# Patient Record
Sex: Male | Born: 1960
Health system: Southern US, Community
[De-identification: ages and names within clinical notes are randomized; demographics above are authoritative.]

## PROBLEM LIST (undated history)

## (undated) DIAGNOSIS — I639 Cerebral infarction, unspecified: Secondary | ICD-10-CM

## (undated) DIAGNOSIS — E782 Mixed hyperlipidemia: Secondary | ICD-10-CM

## (undated) DIAGNOSIS — G919 Hydrocephalus, unspecified: Secondary | ICD-10-CM

## (undated) DIAGNOSIS — K635 Polyp of colon: Secondary | ICD-10-CM

## (undated) DIAGNOSIS — I10 Essential (primary) hypertension: Secondary | ICD-10-CM

## (undated) DIAGNOSIS — I609 Nontraumatic subarachnoid hemorrhage, unspecified: Secondary | ICD-10-CM

## (undated) HISTORY — DX: Polyp of colon: K63.5

## (undated) HISTORY — DX: Mixed hyperlipidemia: E78.2

## (undated) HISTORY — DX: Hydrocephalus, unspecified: G91.9

## (undated) HISTORY — PX: COLONOSCOPY W/ POLYPECTOMY: SHX1380

## (undated) HISTORY — DX: Nontraumatic subarachnoid hemorrhage, unspecified: I60.9

## (undated) HISTORY — DX: Essential (primary) hypertension: I10

## (undated) HISTORY — PX: CSF SHUNT: SHX92

## (undated) HISTORY — PX: VASECTOMY: SHX75

## (undated) HISTORY — DX: Cerebral infarction, unspecified: I63.9

---

## 2008-06-23 DIAGNOSIS — I609 Nontraumatic subarachnoid hemorrhage, unspecified: Secondary | ICD-10-CM

## 2008-06-23 HISTORY — DX: Nontraumatic subarachnoid hemorrhage, unspecified: I60.9

## 2016-12-16 ENCOUNTER — Encounter: Payer: Self-pay | Admitting: Physician Assistant

## 2016-12-16 ENCOUNTER — Ambulatory Visit (INDEPENDENT_AMBULATORY_CARE_PROVIDER_SITE_OTHER): Payer: BLUE CROSS/BLUE SHIELD | Admitting: Physician Assistant

## 2016-12-16 VITALS — BP 106/65 | HR 69 | Resp 16 | Ht 69.0 in | Wt 213.0 lb

## 2016-12-16 DIAGNOSIS — Z8673 Personal history of transient ischemic attack (TIA), and cerebral infarction without residual deficits: Secondary | ICD-10-CM | POA: Diagnosis not present

## 2016-12-16 DIAGNOSIS — Z7689 Persons encountering health services in other specified circumstances: Secondary | ICD-10-CM | POA: Diagnosis not present

## 2016-12-16 DIAGNOSIS — G919 Hydrocephalus, unspecified: Secondary | ICD-10-CM | POA: Diagnosis not present

## 2016-12-16 DIAGNOSIS — I10 Essential (primary) hypertension: Secondary | ICD-10-CM

## 2016-12-16 NOTE — Progress Notes (Signed)
HPI:                                                                James Fritz is a 56 y.o. male who presents to [name]: [facility] today to establish care   Current Concerns include none  HTN: prescribed Lisinopril 10mg  daily, but has been out of medication for a few weeks since moving to [place]. Typically he is compliant with medications. Checks BP's at home. Denies vision change, headache, chest pain with exertion, orthopnea, lightheadedness, syncope and edema. Risk factors include: history of CVA  History of CVA/Hydrocephalus: patient has no focal deficits or symptoms. States he has not needed to follow with neurology for years.  Health Maintenance Health Maintenance  Topic Date Due  . Hepatitis C Screening  04/27/1963  . HIV Screening  07/01/1977  . TETANUS/TDAP  07/01/1981  . COLONOSCOPY  07/01/2012  . INFLUENZA VACCINE  01/21/2017    Past Medical History:  Diagnosis Date  . Colon polyps   . CVA (cerebral vascular accident) (HCC)   . Hydrocephalus   . Hypertension    Past Surgical History:  Procedure Laterality Date  . COLONOSCOPY W/ POLYPECTOMY    . CSF SHUNT    . VASECTOMY     Social History  Substance Use Topics  . Smoking status: Former Smoker    Quit date: 06/23/1992  . Smokeless tobacco: Never Used  . Alcohol use 1.8 - 3.0 oz/week    3 - 5 Standard drinks or equivalent per week   family history includes Diabetes in his father and mother; Heart disease in his brother; Hypertension in his brother and father; Schizophrenia in his sister.  ROS: negative except as noted in the HPI  Medications: No current outpatient prescriptions on file.   No current facility-administered medications for this visit.    No Known Allergies     Objective:  BP 106/65   Pulse 69   Resp 16   Ht 5\' 9"  (1.753 m)   Wt 213 lb (96.6 kg)   BMI 31.45 kg/m  Gen: well-groomed, cooperative, not ill-appearing, no  distress Pulm: Normal work of breathing, normal phonation, clear to auscultation bilaterally CV: Normal rate, regular rhythm, s1 and s2 distinct, no murmurs, clicks or rubs  Neuro: alert and oriented x 3, EOM's intact, no tremor MSK: moving all extremities, normal gait and station, no peripheral edema Psych: good eye contact, normal affect, euthymic mood, normal speech and thought content No results found for this or any previous visit (from the past 72 hour(s)). No results found.   Depression screen PHQ 2/9 12/16/2016  Decreased Interest 0  Down, Depressed, Hopeless 0  PHQ - 2 Score 0    Assessment and Plan: 56 y.o. male with   1. Encounter to establish care - reviewed PMH - Colonoscopy UTD, [facility]  2. History of CVA (cerebrovascular accident) - maintain BP <130/80 - heart healthy lifestyle  3. Hydrocephalus with operating shunt - patient declines referral to Neurology and states he will not go to an appointment with them if one is placed. States he was told by a previous provider that shunt is likely closed at this point and that he does not  need any further imaging or treatment  4. Hypertension goal BP (blood pressure) < 130/80 - BP at goal today without medication - patient to monitor BP's at home and log - DASH eating plan - follow-up in 2 weeks  No orders of the defined types were placed in this encounter.  Patient education and anticipatory guidance given Patient agrees with treatment plan Follow-up in 2 weeks for HTN or sooner as needed  Levonne Hubertharley E. Cummings PA-C

## 2016-12-16 NOTE — Patient Instructions (Addendum)
For your blood pressure: - Check blood pressure at home  - Check around the same time each day in a relaxed setting  - Limit salt. Follow DASH eating plan - Follow-up in 1 month   DASH Eating Plan DASH stands for "Dietary Approaches to Stop Hypertension." The DASH eating plan is a healthy eating plan that has been shown to reduce high blood pressure (hypertension). It may also reduce your risk for type 2 diabetes, heart disease, and stroke. The DASH eating plan may also help with weight loss. What are tips for following this plan? General guidelines  Avoid eating more than 2,300 mg (milligrams) of salt (sodium) a day. If you have hypertension, you may need to reduce your sodium intake to 1,500 mg a day.  Limit alcohol intake to no more than 1 drink a day for nonpregnant women and 2 drinks a day for men. One drink equals 12 oz of beer, 5 oz of wine, or 1 oz of hard liquor.  Work with your health care provider to maintain a healthy body weight or to lose weight. Ask what an ideal weight is for you.  Get at least 30 minutes of exercise that causes your heart to beat faster (aerobic exercise) most days of the week. Activities may include walking, swimming, or biking.  Work with your health care provider or diet and nutrition specialist (dietitian) to adjust your eating plan to your individual calorie needs. Reading food labels  Check food labels for the amount of sodium per serving. Choose foods with less than 5 percent of the Daily Value of sodium. Generally, foods with less than 300 mg of sodium per serving fit into this eating plan.  To find whole grains, look for the word "whole" as the first word in the ingredient list. Shopping  Buy products labeled as "low-sodium" or "no salt added."  Buy fresh foods. Avoid canned foods and premade or frozen meals. Cooking  Avoid adding salt when cooking. Use salt-free seasonings or herbs instead of table salt or sea salt. Check with your health  care provider or pharmacist before using salt substitutes.  Do not fry foods. Cook foods using healthy methods such as baking, boiling, grilling, and broiling instead.  Cook with heart-healthy oils, such as olive, canola, soybean, or sunflower oil. Meal planning   Eat a balanced diet that includes: ? 5 or more servings of fruits and vegetables each day. At each meal, try to fill half of your plate with fruits and vegetables. ? Up to 6-8 servings of whole grains each day. ? Less than 6 oz of lean meat, poultry, or fish each day. A 3-oz serving of meat is about the same size as a deck of cards. One egg equals 1 oz. ? 2 servings of low-fat dairy each day. ? A serving of nuts, seeds, or beans 5 times each week. ? Heart-healthy fats. Healthy fats called Omega-3 fatty acids are found in foods such as flaxseeds and coldwater fish, like sardines, salmon, and mackerel.  Limit how much you eat of the following: ? Canned or prepackaged foods. ? Food that is high in trans fat, such as fried foods. ? Food that is high in saturated fat, such as fatty meat. ? Sweets, desserts, sugary drinks, and other foods with added sugar. ? Full-fat dairy products.  Do not salt foods before eating.  Try to eat at least 2 vegetarian meals each week.  Eat more home-cooked food and less restaurant, buffet, and fast food.  When eating at a restaurant, ask that your food be prepared with less salt or no salt, if possible. What foods are recommended? The items listed may not be a complete list. Talk with your dietitian about what dietary choices are best for you. Grains Whole-grain or whole-wheat bread. Whole-grain or whole-wheat pasta. Brown rice. [place]row. Bulgur. Whole-grain and low-sodium cereals. Pita bread. Low-fat, low-sodium crackers. Whole-wheat flour tortillas. Vegetables Fresh or frozen vegetables (raw, steamed, roasted, or grilled). Low-sodium or reduced-sodium tomato and vegetable juice.  Low-sodium or reduced-sodium tomato sauce and tomato paste. Low-sodium or reduced-sodium canned vegetables. Fruits All fresh, dried, or frozen fruit. Canned fruit in natural juice (without added sugar). Meat and other protein foods Skinless chicken or [place]. Ground chicken or [place]. Pork with fat trimmed off. Fish and seafood. Egg whites. Dried beans, peas, or lentils. Unsalted nuts, nut butters, and seeds. Unsalted canned beans. Lean cuts of beef with fat trimmed off. Low-sodium, lean deli meat. Dairy Low-fat (1%) or fat-free (skim) milk. Fat-free, low-fat, or reduced-fat cheeses. Nonfat, low-sodium ricotta or cottage cheese. Low-fat or nonfat yogurt. Low-fat, low-sodium cheese. Fats and oils Soft margarine without trans fats. Vegetable oil. Low-fat, reduced-fat, or light mayonnaise and salad dressings (reduced-sodium). Canola, safflower, olive, soybean, and sunflower oils. Avocado. Seasoning and other foods Herbs. Spices. Seasoning mixes without salt. Unsalted popcorn and pretzels. Fat-free sweets. What foods are not recommended? The items listed may not be a complete list. Talk with your dietitian about what dietary choices are best for you. Grains Baked goods made with fat, such as croissants, muffins, or some breads. Dry pasta or rice meal packs. Vegetables Creamed or fried vegetables. Vegetables in a cheese sauce. Regular canned vegetables (not low-sodium or reduced-sodium). Regular canned tomato sauce and paste (not low-sodium or reduced-sodium). Regular tomato and vegetable juice (not low-sodium or reduced-sodium). Angie Fava. Olives. Fruits Canned fruit in a light or heavy syrup. Fried fruit. Fruit in cream or butter sauce. Meat and other protein foods Fatty cuts of meat. Ribs. Fried meat. Berniece Salines. Sausage. Bologna and other processed lunch meats. Salami. Fatback. Hotdogs. Bratwurst. Salted nuts and seeds. Canned beans with added salt. Canned or smoked fish. Whole eggs or egg yolks. Chicken  or [place] with skin. Dairy Whole or 2% milk, cream, and half-and-half. Whole or full-fat cream cheese. Whole-fat or sweetened yogurt. Full-fat cheese. Nondairy creamers. Whipped toppings. Processed cheese and cheese spreads. Fats and oils Butter. Stick margarine. Lard. Shortening. Ghee. Bacon fat. Tropical oils, such as coconut, palm kernel, or palm oil. Seasoning and other foods Salted popcorn and pretzels. Onion salt, garlic salt, seasoned salt, table salt, and sea salt. Worcestershire sauce. Tartar sauce. Barbecue sauce. Teriyaki sauce. Soy sauce, including reduced-sodium. Steak sauce. Canned and packaged gravies. Fish sauce. Oyster sauce. Cocktail sauce. Horseradish that you find on the shelf. Ketchup. Mustard. Meat flavorings and tenderizers. Bouillon cubes. Hot sauce and Tabasco sauce. Premade or packaged marinades. Premade or packaged taco seasonings. Relishes. Regular salad dressings. Where to find more information:  National Heart, Lung, and [place]: https://wilson-eaton.com/  American Heart Association: www.heart.org Summary  The DASH eating plan is a healthy eating plan that has been shown to reduce high blood pressure (hypertension). It may also reduce your risk for type 2 diabetes, heart disease, and stroke.  With the DASH eating plan, you should limit salt (sodium) intake to 2,300 mg a day. If you have hypertension, you may need to reduce your sodium intake to 1,500 mg a day.  When on the DASH eating plan,  aim to eat more fresh fruits and vegetables, whole grains, lean proteins, low-fat dairy, and heart-healthy fats.  Work with your health care provider or diet and nutrition specialist (dietitian) to adjust your eating plan to your individual calorie needs. This information is not intended to replace advice given to you by your health care provider. Make sure you discuss any questions you have with your health care provider. Document Released: 05/29/2011 Document Revised:  06/02/2016 Document Reviewed: 06/02/2016 Elsevier Interactive Patient Education  2017 [employer].

## 2016-12-21 ENCOUNTER — Encounter: Payer: Self-pay | Admitting: Physician Assistant

## 2016-12-21 DIAGNOSIS — E1159 Type 2 diabetes mellitus with other circulatory complications: Secondary | ICD-10-CM | POA: Insufficient documentation

## 2016-12-21 DIAGNOSIS — I10 Essential (primary) hypertension: Secondary | ICD-10-CM

## 2017-01-13 ENCOUNTER — Ambulatory Visit: Payer: BLUE CROSS/BLUE SHIELD | Admitting: Physician Assistant

## 2017-01-27 ENCOUNTER — Encounter: Payer: Self-pay | Admitting: Family Medicine

## 2017-01-27 ENCOUNTER — Ambulatory Visit (INDEPENDENT_AMBULATORY_CARE_PROVIDER_SITE_OTHER): Payer: BLUE CROSS/BLUE SHIELD | Admitting: Family Medicine

## 2017-01-27 VITALS — BP 133/77 | HR 84 | Ht 69.0 in | Wt 214.0 lb

## 2017-01-27 DIAGNOSIS — I10 Essential (primary) hypertension: Secondary | ICD-10-CM

## 2017-01-27 MED ORDER — LOSARTAN POTASSIUM 25 MG PO TABS: 25.0000 mg | ORAL_TABLET | Freq: Every day | ORAL | 1 refills | Status: DC

## 2017-01-27 NOTE — Patient Instructions (Signed)
Thank you for coming in today. Restart losartan.  Schedule annual physical with Vinetta BergamoCharley in November or December.  Get labs then.    Losartan tablets What is this medicine? LOSARTAN (loe SAR tan) is used to treat high blood pressure and to reduce the risk of stroke in certain patients. This drug also slows the progression of kidney disease in patients with diabetes. This medicine may be used for other purposes; ask your health care provider or pharmacist if you have questions. COMMON BRAND NAME(S): Cozaar What should I tell my health care provider before I take this medicine? They need to know if you have any of these conditions: -heart failure -kidney or liver disease -an unusual or allergic reaction to losartan, other medicines, foods, dyes, or preservatives -pregnant or trying to get pregnant -breast-feeding How should I use this medicine? Take this medicine by mouth with a glass of water. Follow the directions on the prescription label. This medicine can be taken with or without food. Take your doses at regular intervals. Do not take your medicine more often than directed. Talk to your pediatrician regarding the use of this medicine in children. Special care may be needed. Overdosage: If you think you have taken too much of this medicine contact a poison control center or emergency room at once. NOTE: This medicine is only for you. Do not share this medicine with others. What if I miss a dose? If you miss a dose, take it as soon as you can. If it is almost time for your next dose, take only that dose. Do not take double or extra doses. What may interact with this medicine? -blood pressure medicines -diuretics, especially triamterene, spironolactone, or amiloride -fluconazole -NSAIDs, medicines for pain and inflammation, like ibuprofen or naproxen -potassium salts or potassium supplements -rifampin This list may not describe all possible interactions. Give your health care provider  a list of all the medicines, herbs, non-prescription drugs, or dietary supplements you use. Also tell them if you smoke, drink alcohol, or use illegal drugs. Some items may interact with your medicine. What should I watch for while using this medicine? Visit your doctor or health care professional for regular checks on your progress. Check your blood pressure as directed. Ask your doctor or health care professional what your blood pressure should be and when you should contact him or her. Call your doctor or health care professional if you notice an irregular or fast heart beat. Women should inform their doctor if they wish to become pregnant or think they might be pregnant. There is a potential for serious side effects to an unborn child, particularly in the second or third trimester. Talk to your health care professional or pharmacist for more information. You may get drowsy or dizzy. Do not drive, use machinery, or do anything that needs mental alertness until you know how this drug affects you. Do not stand or sit up quickly, especially if you are an older patient. This reduces the risk of dizzy or fainting spells. Alcohol can make you more drowsy and dizzy. Avoid alcoholic drinks. Avoid salt substitutes unless you are told otherwise by your doctor or health care professional. Do not treat yourself for coughs, colds, or pain while you are taking this medicine without asking your doctor or health care professional for advice. Some ingredients may increase your blood pressure. What side effects may I notice from receiving this medicine? Side effects that you should report to your doctor or health care professional as soon as  possible: -confusion, dizziness, light headedness or fainting spells -decreased amount of urine passed -difficulty breathing or swallowing, hoarseness, or tightening of the throat -fast or irregular heart beat, palpitations, or chest pain -skin rash, itching -swelling of your  face, lips, tongue, hands, or feet Side effects that usually do not require medical attention (report to your doctor or health care professional if they continue or are bothersome): -cough -decreased sexual function or desire -headache -nasal congestion or stuffiness -nausea or stomach pain -sore or cramping muscles This list may not describe all possible side effects. Call your doctor for medical advice about side effects. You may report side effects to FDA at 1-800-FDA-1088. Where should I keep my medicine? Keep out of the reach of children. Store at room temperature between 15 and 30 degrees C (59 and 86 degrees F). Protect from light. Keep container tightly closed. Throw away any unused medicine after the expiration date. NOTE: This sheet is a summary. It may not cover all possible information. If you have questions about this medicine, talk to your doctor, pharmacist, or health care provider.  2018 Elsevier/Gold Standard (2007-08-20 16:42:18)

## 2017-01-27 NOTE — Progress Notes (Signed)
       James MurdochJoseph Fritz is a 56 y.o. male [facility] today for follow up HTN. Jomarie LongsJoseph had a subarachnoid bleed a few years ago resulting in hydrocephalus. He has had mildly elevated blood pressure. His prior primary care doctor prescribed lisinopril which controlled the blood pressure quite well but caused a cough. He was switched to a similar medicine that he can't remove the name of which worked well. He would like a refill of these medicines medications if possible. He denies any chest pain palpitations or shortness of breath. He feels well otherwise.   Past Medical History:  Diagnosis Date  . Colon polyps   . CVA (cerebral vascular accident) (HCC)   . Hydrocephalus   . Hypertension    Past Surgical History:  Procedure Laterality Date  . COLONOSCOPY W/ POLYPECTOMY    . CSF SHUNT    . VASECTOMY     Social History  Substance Use Topics  . Smoking status: Former Smoker    Quit date: 06/23/1992  . Smokeless tobacco: Never Used  . Alcohol use 1.8 - 3.0 oz/week    3 - 5 Standard drinks or equivalent per week   family history includes Diabetes in his father and mother; Heart disease in his brother; Hypertension in his brother and father; Schizophrenia in his sister.  ROS as above:  Medications: Current Outpatient Prescriptions  Medication Sig Dispense Refill  . losartan (COZAAR) 25 MG tablet Take 1 tablet (25 mg total) by mouth daily. 90 tablet 1   No current facility-administered medications for this visit.    No Known Allergies  Health Maintenance Health Maintenance  Topic Date Due  . Hepatitis C Screening  [birth year]  . HIV Screening  07/01/1977  . TETANUS/TDAP  07/01/1981  . COLONOSCOPY  07/01/2012  . INFLUENZA VACCINE  01/21/2017     Exam:  BP 133/77   Pulse 84   Ht 5\' 9"  (1.753 m)   Wt 214 lb (97.1 kg)   BMI 31.60 kg/m    133/77 Gen: Well NAD HEENT: EOMI,  MMM Lungs: Normal work of breathing. CTABL Heart: RRR no MRG Abd: NABS, Soft. Nondistended, Nontender Exts: Brisk capillary refill, warm and well perfused.    No results found for this or any previous visit (from the past 72 hour(s)). No results found.    Assessment and Plan: 56 y.o. male with mildly elevated blood pressure. Plan to use low-dose losartan. We'll recheck metabolic panel this fall as part of the wellness visit. Return to care of PCP.   No orders of the defined types were placed in this encounter.  Meds ordered this encounter  Medications  . losartan (COZAAR) 25 MG tablet    Sig: Take 1 tablet (25 mg total) by mouth daily.    Dispense:  90 tablet    Refill:  1     Discussed warning signs or symptoms. Please see discharge instructions. Patient expresses understanding.

## 2017-04-27 ENCOUNTER — Encounter: Payer: BLUE CROSS/BLUE SHIELD | Admitting: Physician Assistant

## 2017-04-27 DIAGNOSIS — Z0189 Encounter for other specified special examinations: Secondary | ICD-10-CM

## 2017-05-08 ENCOUNTER — Ambulatory Visit (INDEPENDENT_AMBULATORY_CARE_PROVIDER_SITE_OTHER): Payer: BLUE CROSS/BLUE SHIELD | Admitting: Physician Assistant

## 2017-05-08 ENCOUNTER — Encounter: Payer: Self-pay | Admitting: Physician Assistant

## 2017-05-08 VITALS — BP 118/76 | HR 71 | Ht 69.0 in | Wt 217.0 lb

## 2017-05-08 DIAGNOSIS — Z130 Encounter for screening for diseases of the blood and blood-forming organs and certain disorders involving the immune mechanism: Secondary | ICD-10-CM | POA: Diagnosis not present

## 2017-05-08 DIAGNOSIS — Z125 Encounter for screening for malignant neoplasm of prostate: Secondary | ICD-10-CM

## 2017-05-08 DIAGNOSIS — Z0000 Encounter for general adult medical examination without abnormal findings: Secondary | ICD-10-CM

## 2017-05-08 DIAGNOSIS — E6609 Other obesity due to excess calories: Secondary | ICD-10-CM | POA: Diagnosis not present

## 2017-05-08 DIAGNOSIS — Z1211 Encounter for screening for malignant neoplasm of colon: Secondary | ICD-10-CM

## 2017-05-08 DIAGNOSIS — Z23 Encounter for immunization: Secondary | ICD-10-CM

## 2017-05-08 DIAGNOSIS — Z131 Encounter for screening for diabetes mellitus: Secondary | ICD-10-CM | POA: Diagnosis not present

## 2017-05-08 DIAGNOSIS — Z973 Presence of spectacles and contact lenses: Secondary | ICD-10-CM | POA: Insufficient documentation

## 2017-05-08 DIAGNOSIS — Z13220 Encounter for screening for lipoid disorders: Secondary | ICD-10-CM | POA: Diagnosis not present

## 2017-05-08 DIAGNOSIS — Z6832 Body mass index (BMI) 32.0-32.9, adult: Secondary | ICD-10-CM | POA: Insufficient documentation

## 2017-05-08 DIAGNOSIS — I10 Essential (primary) hypertension: Secondary | ICD-10-CM

## 2017-05-08 DIAGNOSIS — Z1159 Encounter for screening for other viral diseases: Secondary | ICD-10-CM | POA: Diagnosis not present

## 2017-05-08 DIAGNOSIS — Z136 Encounter for screening for cardiovascular disorders: Secondary | ICD-10-CM

## 2017-05-08 DIAGNOSIS — E66811 Obesity, class 1: Secondary | ICD-10-CM | POA: Insufficient documentation

## 2017-05-08 MED ORDER — LOSARTAN POTASSIUM 25 MG PO TABS: 50.0000 mg | ORAL_TABLET | Freq: Every day | ORAL | 1 refills | Status: DC

## 2017-05-08 NOTE — Progress Notes (Signed)
HPI:                                                                Gala MurdochJoseph Paulo is a 56 y.o. male [facility] today for annual physical exam  Current concerns: none  Past Medical History:  Diagnosis Date  . Colon polyps   . CVA (cerebral vascular accident) (HCC)   . Hydrocephalus   . Hypertension   . SAH (subarachnoid hemorrhage) (HCC) 2010   Past Surgical History:  Procedure Laterality Date  . COLONOSCOPY W/ POLYPECTOMY    . CSF SHUNT    . VASECTOMY     Social History   Tobacco Use  . Smoking status: Former Smoker    Last attempt to quit: 06/23/1992    Years since quitting: 24.8  . Smokeless tobacco: Never Used  Substance Use Topics  . Alcohol use: Yes    Alcohol/week: 1.8 - 3.0 oz    Types: 3 - 5 Standard drinks or equivalent per week   family history includes Diabetes in his father and mother; Heart disease in his brother; Hypertension in his brother and father; Schizophrenia in his sister.  ROS: negative except as noted in the HPI  Medications: Current Outpatient Medications  Medication Sig Dispense Refill  . losartan (COZAAR) 25 MG tablet Take 2 tablets (50 mg total) daily by mouth. 180 tablet 1   No current facility-administered medications for this visit.    No Known Allergies   Objective:  BP (!) 149/83   Pulse 76   Ht 5\' 9"  (1.753 m)   Wt 217 lb (98.4 kg)   BMI 32.05 kg/m  General Appearance:  Alert, cooperative, no distress, appropriate for age, obese male                            Head:  Normocephalic, without obvious abnormality                             Eyes:  PERRL, EOM's intact, conjunctiva and cornea clear, wearing contact lenses                             Ears:  TM pearly gray color and semitransparent, external ear canals normal, both ears                            Nose:  Nares symmetrical                          Throat:  Lips, tongue, and mucosa are moist, pink, and  intact; good dentition                             Neck:  Supple; symmetrical, trachea midline, no adenopathy; thyroid: no enlargement, symmetric, no tenderness/mass/nodules                             Back:  Symmetrical, no curvature,  ROM normal                                 Lungs:  Clear to auscultation bilaterally, respirations unlabored                             Heart:  regular rate & normal rhythm, S1 and S2 normal, no murmurs, rubs, or gallops                     Abdomen:  Obese, soft, non-tender, no mass or organomegaly, exam limited due to body habitus              Genitourinary:  deferred         Musculoskeletal:  Tone and strength strong and symmetrical, all extremities; no joint pain or edema, normal gait and station                                      Lymphatic:  No adenopathy             Skin/Hair/Nails:  Skin warm, dry and intact, no rashes or abnormal dyspigmentation on limited exam                   Neurologic:  Alert and oriented x3, no cranial nerve deficits, DTR's intact, sensation grossly intact, normal gait and station, no tremor Psych: well-groomed, cooperative, good eye contact, euthymic mood, affect mood-congruent, speech is articulate, and thought processes clear and goal-directed   Dep[facility] screen [facility] 05/08/2017 12/16/2016  Decreased Interest 0 0  Down, Depressed, Hopeless 0 0  PHQ - 2 Score 0 0     No results found for this or any previous visit (from the past 72 hour(s)). No results found.    Assessment and Plan: 56 y.o. male with   1. Annual physical exam - reviewed health maintenance - Colonoscopy due in 2019, patient requesting referral today - negative PHQ2 - influenza given today - declines Shingrix - Tdap UTD per patient - CBC - Comprehensive metabolic panel - Hepatitis C antibody - Lipid Panel w/reflex Direct LDL - PSA  2. Screening for blood disease - CBC  3. Screening for diabetes mellitus - Comprehensive metabolic  panel  4. Encounter for hepatitis C screening test for low risk patient - Hepatitis C antibody  5. Encounter for lipid screening for cardiovascular disease - Lipid Panel w/reflex Direct LDL  6. Class 1 obesity due to excess calories with serious comorbidity and body mass index (BMI) of 32.0 to 32.9 in adult - counseled on weight loss through decreasing caloric intake and increasing aerobic exercise  7. Hypertension goal BP (blood pressure) < 130/80 BP Readings from Last 3 Encounters:  05/08/17 118/76  01/27/17 133/77  12/16/16 106/65  - initial BP out of range, recheck BP in range. Patient to monitor BP at home and titrate Losartan to goal of 130/80 or less. Counseled on therapeutic lifestyle changes - losartan (COZAAR) 25 MG tablet; Take 2 tablets (50 mg total) daily by mouth.  Dispense: 180 tablet; Refill: 1  8. Wears contact lenses   9. Colon cancer screening - Ambulatory referral to Gastroenterology for colonoscopy  10. Screening PSA (prostate specific antigen) - PSA  11. Need for immunization against influenza - Flu Vaccine QUAD 36+  mos IM   Patient education and anticipatory guidance given Patient agrees with treatment plan Follow-up in 1 year for CPE with fasting labs or sooner as needed  Levonne Hubert PA-C

## 2017-05-08 NOTE — Patient Instructions (Addendum)
I have also ordered fasting labs. The lab is a walk-in open M-F 7:30a-4:30p (closed 12:30-1:30p). Nothing to eat or drink after midnight or at least 8 hours before your blood draw. You can have water and your medications.   For your blood pressure: - BP goal <130/80 - Continue your Losartan daily. If BP consistently above 130/80, increase to 50 mg daily (2 tabs) - Limit salt to <2000 mg/day - Follow DASH eating plan - limit alcohol to 2 standard drinks per day - avoid tobacco products - weight loss: 7% of current body weight - Follow-up every 6 months   Physical Activity Recommendations for modifying lipids and lowering blood pressure Engage in aerobic physical activity to reduce LDL-cholesterol, non-HDL-cholesterol, and blood pressure  Frequency: 3-4 sessions per week  Intensity: moderate to vigorous  Duration: 40 minutes on average  Physical Activity Recommendations for secondary prevention 1. Aerobic exercise  Frequency: 3-5 sessions per week  Intensity: 50-80% capacity  Duration: 20 - 60 minutes  Examples: walking, treadmill, cycling, rowing, stair climbing, and arm/leg ergometry  2. Resistance exercise  Frequency: 2-3 sessions per week  Intensity: 10-15 repetitions/set to moderate fatigue  Duration: 1-3 sets of 8-10 upper and lower body exercises  Examples: calisthenics, elastic bands, cuff/hand weights, dumbbels, free weights, wall pulleys, and weight machines  Heart-Healthy Lifestyle  Eating a diet rich in vegetables, fruits and whole grains: also includes low-fat dairy products, poultry, fish, legumes, and nuts; limit intake of sweets, sugar-sweetened beverages and red meats  Getting regular exercise  Maintaining a healthy weight  Not smoking or getting help quitting  Staying on top of your health; for some people, lifestyle changes alone may not be enough to prevent a heart attack or stroke. In these cases, taking a statin at the right dose will most  likely be necessary   Exercising to Lose Weight Exercising can help you to lose weight. In order to lose weight through exercise, you need to do vigorous-intensity exercise. You can tell that you are exercising with vigorous intensity if you are breathing very hard and fast and cannot hold a conversation while exercising. Moderate-intensity exercise helps to maintain your current weight. You can tell that you are exercising at a moderate level if you have a higher heart rate and faster breathing, but you are still able to hold a conversation. How often should I exercise? Choose an activity that you enjoy and set realistic goals. Your health care provider can help you to make an activity plan that works for you. Exercise regularly as directed by your health care provider. This may include:  Doing resistance training twice each week, such as: ? Push-ups. ? Sit-ups. ? Lifting weights. ? Using resistance bands.  Doing a given intensity of exercise for a given amount of time. Choose from these options: ? 150 minutes of moderate-intensity exercise every week. ? 75 minutes of vigorous-intensity exercise every week. ? A mix of moderate-intensity and vigorous-intensity exercise every week.  Children, pregnant women, people who are out of shape, people who are overweight, and older adults may need to consult a health care provider for individual recommendations. If you have any sort of medical condition, be sure to consult your health care provider before starting a new exercise program. What are some activities that can help me to lose weight?  Walking at a rate of at least 4.5 miles an hour.  Jogging or running at a rate of 5 miles per hour.  Biking at a rate of at  least 10 miles per hour.  Lap swimming.  Roller-skating or in-line skating.  Cross-country skiing.  Vigorous competitive sports, such as football, basketball, and soccer.  Jumping rope.  Aerobic dancing. How can I be more  active in my day-to-day activities?  Use the stairs instead of the elevator.  Take a walk during your lunch break.  If you drive, park your car farther away from work or school.  If you take public transportation, get off one stop early and walk the rest of the way.  Make all of your phone calls while standing up and walking around.  Get up, stretch, and walk around every 30 minutes throughout the day. What guidelines should I follow while exercising?  Do not exercise so much that you hurt yourself, feel dizzy, or get very short of breath.  Consult your health care provider prior to starting a new exercise program.  Wear comfortable clothes and shoes with good support.  Drink plenty of water while you exercise to prevent dehydration or heat stroke. Body water is lost during exercise and must be replaced.  Work out until you breathe faster and your heart beats faster. This information is not intended to replace advice given to you by your health care provider. Make sure you discuss any questions you have with your health care provider. Document Released: 07/12/2010 Document Revised: 11/15/2015 Document Reviewed: 11/10/2013 Elsevier Interactive Patient Education  [employer]2018 Else[employer] Inc.

## 2017-05-11 ENCOUNTER — Encounter: Payer: Self-pay | Admitting: Physician Assistant

## 2017-05-12 ENCOUNTER — Encounter: Payer: Self-pay | Admitting: Sports Medicine

## 2017-05-12 ENCOUNTER — Ambulatory Visit (INDEPENDENT_AMBULATORY_CARE_PROVIDER_SITE_OTHER): Payer: BLUE CROSS/BLUE SHIELD | Admitting: Sports Medicine

## 2017-05-12 DIAGNOSIS — L84 Corns and callosities: Secondary | ICD-10-CM

## 2017-05-12 MED ORDER — HYDROCODONE-ACETAMINOPHEN 5-325 MG PO TABS: 1.0000 | ORAL_TABLET | Freq: Three times a day (TID) | ORAL | 0 refills | Status: DC | PRN

## 2017-05-12 NOTE — Patient Instructions (Signed)
Corns and Calluses Corns are small areas of thickened skin that occur on the top, sides, or tip of a toe. They contain a cone-shaped core that is often a trapped sweat gland with a point that can press on a nerve below. This causes pain. Calluses are areas of thickened skin that can occur anywhere on the body including hands, fingers, palms, soles of the feet, and heels.Calluses are usually larger than corns. What are the causes? Corns and calluses are caused by rubbing (friction) or pressure, such as from shoes that are too tight or do not fit properly. What increases the risk? Corns are more likely to develop in people who have toe deformities, such as hammer toes. Since calluses can occur with friction to any area of the skin, calluses are more likely to develop in people who:  Work with their hands.  Wear shoes that fit poorly, shoes that are too tight, or shoes that are high-heeled.  Have toes deformities.  What are the signs or symptoms? Symptoms of a corn or callus include:  A hard growth on the skin.  Pain or tenderness under the skin.  Redness and swelling.  Increased discomfort while wearing tight-fitting shoes.  How is this diagnosed? Corns and calluses may be diagnosed with a medical history and physical exam. How is this treated? Corns and calluses may be treated with:  Removing the cause of the friction or pressure. This may include: ? Changing your shoes. ? Wearing shoe inserts (orthotics) or other protective layers in your shoes, such as a corn pad. ? Wearing gloves.  Medicines to help soften skin in the hardened, thickened areas.  Reducing the size of the corn or callus by removing the dead layers of skin.  Antibiotic medicines to treat infection.  Surgery, if a toe deformity is the cause.  Follow these instructions at home:  Take medicines only as directed by your health care provider.  If you were prescribed an antibiotic, finish all of it even if  you start to feel better.  Wear shoes that fit well. Avoid wearing high-heeled shoes and shoes that are too tight or too loose.  Wear any padding, protective layers, gloves, or orthotics as directed by your health care provider.  Soak your hands or feet and then use a file or pumice stone to soften your corn or callus. Do this as directed by your health care provider.  Check your corn or callus every day for signs of infection. Watch for: ? Redness, swelling, or pain. ? Fluid, blood, or pus. Contact a health care provider if:  Your symptoms do not improve with treatment.  You have increased redness, swelling, or pain at the site of your corn or callus.  You have fluid, blood, or pus coming from your corn or callus.  You have new symptoms. This information is not intended to replace advice given to you by your health care provider. Make sure you discuss any questions you have with your health care provider. Document Released: 03/15/2004 Document Revised: 12/28/2015 Document Reviewed: 06/05/2014 Elsevier Interactive Patient Education  [employer]2018 Else[employer] Inc.

## 2017-05-12 NOTE — Assessment & Plan Note (Signed)
Surgical enucleation of the corn was performed under local anesthesia. Hyfrecation for hemostasis. Change dressing at least twice a day, minimize weightbearing for the next week, hydrocodone for postoperative pain. Return to see me in 2 weeks.

## 2017-05-12 NOTE — Progress Notes (Signed)
   Subjective:    I'm seeing this patient as a consultation for: Gena Frayharley Cummings, PA-C  CC: Left foot pain  HPI: For months to years of this pleasant 56 year old male has had a painful lesion that he notes on the bottom of his left foot under the fifth MTP.  Pain is severe, persistent, it feels like he is stepping on a needle.  He is referred to me for further evaluation and definitive treatment.  Past medical history, Surgical history, Family history not pertinant except as noted below, Social history, Allergies, and medications have been entered into the medical record, reviewed, and no changes needed.   Review of Systems: No headache, visual changes, nausea, vomiting, diarrhea, constipation, dizziness, abdominal pain, skin rash, fevers, chills, night sweats, weight loss, swollen lymph nodes, body aches, joint swelling, muscle aches, chest pain, shortness of breath, mood changes, visual or auditory hallucinations.   Objective:   General: Well Developed, well nourished, and in no acute distress.  Neuro:  Extra-ocular muscles intact, able to move all 4 extremities, sensation grossly intact.  Deep tendon reflexes tested were normal. Psych: Alert and oriented, mood congruent with affect. ENT:  Ears and nose appear unremarkable.  Hearing grossly normal. Neck: Unremarkable overall appearance, trachea midline.  No visible thyroid enlargement. Eyes: Conjunctivae and lids appear unremarkable.  Pupils equal and round. Skin: Warm and dry, no rashes noted.  Cardiovascular: Pulses palpable, no extremity edema. Left foot: There is a visible corn under the fifth MTP, exquisitely tender to palpation. Range of motion is full in all directions. Strength is 5/5 in all directions. No hallux valgus. No pes cavus or pes planus. No abnormal callus noted. No pain over the navicular prominence, or base of fifth metatarsal. No tenderness to palpation of the calcaneal insertion of plantar fascia. No pain at  the Achilles insertion. No pain over the calcaneal bursa. No pain of the retrocalcaneal bursa. No tenderness to palpation over the tarsals, metatarsals, or phalanges. No hallux rigidus or limitus. No tenderness palpation over interphalangeal joints. No pain with compression of the metatarsal heads. Neurovascularly intact distally.  Procedure: Surgical enucleation of a left fifth MTP plantar corn/trimming of hyperkeratotic skin lesion. Risks, benefits, and alternatives explained and consent obtained. Time out conducted. Surface prepped with alcohol. 1 mL of lidocaine with epinephrine infiltrated underneath the corn Adequate anesthesia ensured. Area prepped and draped in a sterile fashion. Using a #11 blade I performed a surgical enucleation of the corn down to bleeding tissue, the Hyfrecator was used to achieve hemostasis. Pt stable.  Impression and Recommendations:   This case required medical decision making of moderate complexity.  Corn of foot Surgical enucleation of the corn was performed under local anesthesia. Hyfrecation for hemostasis. Change dressing at least twice a day, minimize weightbearing for the next week, hydrocodone for postoperative pain. Return to see me in 2 weeks. ___________________________________________ Ihor Austinhomas J. Benjamin Stainhekkekandam, M.D., ABFM., CAQSM. Primary Care and Sports Medicine [facility]  Adjunct Instructor of Family Medicine  [employer]

## 2017-05-21 LAB — HM COLONOSCOPY

## 2017-05-26 ENCOUNTER — Ambulatory Visit (INDEPENDENT_AMBULATORY_CARE_PROVIDER_SITE_OTHER): Payer: BLUE CROSS/BLUE SHIELD | Admitting: Sports Medicine

## 2017-05-26 ENCOUNTER — Encounter: Payer: Self-pay | Admitting: Sports Medicine

## 2017-05-26 DIAGNOSIS — L84 Corns and callosities: Secondary | ICD-10-CM

## 2017-05-26 NOTE — Progress Notes (Signed)
  Subjective: This is a pleasant 56 year old male, he is approximately 2 weeks post enucleation of a corn on his left plantar fifth MTP, if he is doing extremely well, currently pain-free.  Objective: General: Well-developed, well-nourished, and in no acute distress. Left foot: No visible erythema or swelling. Surgical site is well-healed.   Range of motion is full in all directions. Strength is 5/5 in all directions. No hallux valgus. No pes cavus or pes planus. No abnormal callus noted. No pain over the navicular prominence, or base of fifth metatarsal. No tenderness to palpation of the calcaneal insertion of plantar fascia. No pain at the Achilles insertion. No pain over the calcaneal bursa. No pain of the retrocalcaneal bursa. No tenderness to palpation over the tarsals, metatarsals, or phalanges. No hallux rigidus or limitus. No tenderness palpation over interphalangeal joints. No pain with compression of the metatarsal heads. Neurovascularly intact distally.  Assessment/plan:   Corn of foot Doing well 2 weeks post corn enucleation, return as needed.  ___________________________________________ Ihor Austinhomas J. Benjamin Stainhekkekandam, M.D., ABFM., CAQSM. [facility] Care and Sports Medicine [facility]  Adjunct Instructor of Family Medicine  [employer]

## 2017-05-26 NOTE — Assessment & Plan Note (Addendum)
Doing well 2 weeks post corn enucleation, return as needed.

## 2017-06-08 ENCOUNTER — Encounter: Payer: Self-pay | Admitting: Physician Assistant

## 2017-06-08 ENCOUNTER — Encounter: Payer: Self-pay | Admitting: *Deleted

## 2017-06-20 LAB — CBC
HEMATOCRIT: 44.4 % (ref 38.5–50.0)
Hemoglobin: 15.3 g/dL (ref 13.2–17.1)
MCH: 30.1 pg (ref 27.0–33.0)
MCHC: 34.5 g/dL (ref 32.0–36.0)
MCV: 87.2 fL (ref 80.0–100.0)
MPV: 10.2 fL (ref 7.5–12.5)
Platelets: 192 10*3/uL (ref 140–400)
RBC: 5.09 10*6/uL (ref 4.20–5.80)
RDW: 12.5 % (ref 11.0–15.0)
WBC: 5.9 10*3/uL (ref 3.8–10.8)

## 2017-06-20 LAB — COMPREHENSIVE METABOLIC PANEL
AG RATIO: 1.7 (calc) (ref 1.0–2.5)
ALBUMIN MSPROF: 4.1 g/dL (ref 3.6–5.1)
ALKALINE PHOSPHATASE (APISO): 68 U/L (ref 40–115)
ALT: 24 U/L (ref 9–46)
AST: 21 U/L (ref 10–35)
BILIRUBIN TOTAL: 0.5 mg/dL (ref 0.2–1.2)
BUN: 14 mg/dL (ref 7–25)
CALCIUM: 9.3 mg/dL (ref 8.6–10.3)
CHLORIDE: 104 mmol/L (ref 98–110)
CO2: 27 mmol/L (ref 20–32)
Creat: 0.92 mg/dL (ref 0.70–1.33)
GLOBULIN: 2.4 g/dL (ref 1.9–3.7)
Glucose, Bld: 137 mg/dL — ABNORMAL HIGH (ref 65–99)
Potassium: 4.4 mmol/L (ref 3.5–5.3)
Sodium: 139 mmol/L (ref 135–146)
Total Protein: 6.5 g/dL (ref 6.1–8.1)

## 2017-06-20 LAB — HEPATITIS C ANTIBODY
HEP C AB: NONREACTIVE
SIGNAL TO CUT-OFF: 0.01 (ref <1.00)

## 2017-06-20 LAB — LIPID PANEL W/REFLEX DIRECT LDL
CHOLESTEROL: 226 mg/dL — AB (ref <200)
HDL: 51 mg/dL (ref >40)
LDL Cholesterol (Calc): 157 mg/dL (calc) — ABNORMAL HIGH
Non-HDL Cholesterol (Calc): 175 mg/dL (calc) — ABNORMAL HIGH (ref <130)
Total CHOL/HDL Ratio: 4.4 (calc) (ref <5.0)
Triglycerides: 79 mg/dL (ref <150)

## 2017-06-20 LAB — PSA: PSA: 1.2 ng/mL (ref <=4.0)

## 2017-06-22 ENCOUNTER — Encounter: Payer: Self-pay | Admitting: Physician Assistant

## 2017-06-22 DIAGNOSIS — E782 Mixed hyperlipidemia: Secondary | ICD-10-CM

## 2017-06-22 HISTORY — DX: Mixed hyperlipidemia: E78.2

## 2017-06-22 NOTE — Progress Notes (Signed)
Total and LDL cholesterol are high. He is at increased risk of heart attack and would benefit from cholesterol-lowering medication. I would recommend starting Atorvastatin nightly in addition to heart healthy diet and regular aerobic exercise. Recheck fasting lipid panel in 3 months.

## 2017-08-21 ENCOUNTER — Other Ambulatory Visit: Payer: Self-pay | Admitting: Family Medicine

## 2017-09-01 ENCOUNTER — Other Ambulatory Visit: Payer: Self-pay

## 2017-09-01 MED ORDER — LOSARTAN POTASSIUM 25 MG PO TABS: 25.0000 mg | ORAL_TABLET | Freq: Every day | ORAL | 0 refills | Status: DC

## 2017-12-25 ENCOUNTER — Other Ambulatory Visit: Payer: Self-pay | Admitting: Physician Assistant

## 2018-03-02 DIAGNOSIS — I609 Nontraumatic subarachnoid hemorrhage, unspecified: Secondary | ICD-10-CM | POA: Diagnosis not present

## 2018-03-02 DIAGNOSIS — Z6834 Body mass index (BMI) 34.0-34.9, adult: Secondary | ICD-10-CM | POA: Diagnosis not present

## 2018-03-25 ENCOUNTER — Other Ambulatory Visit: Payer: Self-pay | Admitting: Physician Assistant

## 2018-04-09 ENCOUNTER — Other Ambulatory Visit: Payer: Self-pay | Admitting: Physician Assistant

## 2018-05-12 ENCOUNTER — Ambulatory Visit (INDEPENDENT_AMBULATORY_CARE_PROVIDER_SITE_OTHER): Payer: BLUE CROSS/BLUE SHIELD | Admitting: Physician Assistant

## 2018-05-12 ENCOUNTER — Encounter: Payer: Self-pay | Admitting: Physician Assistant

## 2018-05-12 VITALS — BP 132/86 | HR 76 | Wt 231.0 lb

## 2018-05-12 DIAGNOSIS — Z0000 Encounter for general adult medical examination without abnormal findings: Secondary | ICD-10-CM

## 2018-05-12 DIAGNOSIS — I10 Essential (primary) hypertension: Secondary | ICD-10-CM

## 2018-05-12 DIAGNOSIS — E782 Mixed hyperlipidemia: Secondary | ICD-10-CM | POA: Diagnosis not present

## 2018-05-12 DIAGNOSIS — R5383 Other fatigue: Secondary | ICD-10-CM

## 2018-05-12 DIAGNOSIS — Z125 Encounter for screening for malignant neoplasm of prostate: Secondary | ICD-10-CM

## 2018-05-12 DIAGNOSIS — Z131 Encounter for screening for diabetes mellitus: Secondary | ICD-10-CM

## 2018-05-12 DIAGNOSIS — L918 Other hypertrophic disorders of the skin: Secondary | ICD-10-CM

## 2018-05-12 DIAGNOSIS — Z1329 Encounter for screening for other suspected endocrine disorder: Secondary | ICD-10-CM

## 2018-05-12 DIAGNOSIS — R609 Edema, unspecified: Secondary | ICD-10-CM

## 2018-05-12 DIAGNOSIS — R600 Localized edema: Secondary | ICD-10-CM

## 2018-05-12 DIAGNOSIS — R7989 Other specified abnormal findings of blood chemistry: Secondary | ICD-10-CM

## 2018-05-12 DIAGNOSIS — Z130 Encounter for screening for diseases of the blood and blood-forming organs and certain disorders involving the immune mechanism: Secondary | ICD-10-CM

## 2018-05-12 DIAGNOSIS — R7301 Impaired fasting glucose: Secondary | ICD-10-CM

## 2018-05-12 NOTE — Progress Notes (Signed)
HPI:                                                                James Fritz is a 57 y.o. male who [facility] today for annual physical exam  HTN: started on Losartan daily. Compliant with medications. Does not check BP's at home. Does not currently exercise. Denies vision change, headache, chest pain with exertion, orthopnea, lightheadedness, syncope and edema. Risk factors include: male sex, age>55, family hx  The 10-year ASCVD risk score James Fritz(Goff DC Jr., et al., 2013) is: 7.8%   Values used to calculate the score:     Age: [age] years     Sex: Male     Is Non-Hispanic African American: No     Diabetic: No     Tobacco smoker: No     Systolic Blood Pressure: 132 mmHg     Is BP treated: Yes     HDL Cholesterol: 51 mg/dL     Total Cholesterol: 226 mg/dL  [facility] 2[facility] 05/12/2018 05/08/2017 12/16/2016  Decreased Interest 0 0 0  Down, Depressed, Hopeless 0 0 0  PHQ - 2 Score 0 0 0  Altered sleeping 0 - -  Tired, decreased energy 2 - -  Change in appetite 1 - -  Feeling bad or failure about yourself  0 - -  Trouble concentrating 1 - -  Moving slowly or fidgety/restless 0 - -  Suicidal thoughts 0 - -  PHQ-9 Score 4 - -    GAD 7 : Generalized Anxiety Score 05/12/2018  Nervous, Anxious, on Edge 0  Control/stop worrying 0  Worry too much - different things 1  Trouble relaxing 1  Restless 0  Easily annoyed or irritable 0  Afraid - awful might happen 0  Total GAD 7 Score 2      Past Medical History:  Diagnosis Date  . Colon polyps   . CVA (cerebral vascular accident) (HCC)   . Hydrocephalus (HCC)   . Hypertension   . Mixed hyperlipidemia 06/22/2017   10-yr ASCVD risk 7%  . SAH (subarachnoid hemorrhage) (HCC) 2010   Past Surgical History:  Procedure Laterality Date  . COLONOSCOPY W/ POLYPECTOMY    . CSF SHUNT    . VASECTOMY     Social History   Tobacco Use  . Smoking status: Former Smoker     Last attempt to quit: 06/23/1992    Years since quitting: 25.9  . Smokeless tobacco: Never Used  Substance Use Topics  . Alcohol use: Yes    Alcohol/week: 3.0 - 5.0 standard drinks    Types: 3 - 5 Standard drinks or equivalent per week   family history includes Diabetes in his father and mother; Heart disease in his brother; Hypertension in his brother and father; Schizophrenia in his sister.    ROS: Review of Systems  Constitutional: Positive for malaise/fatigue.  Cardiovascular: Positive for leg swelling.  All other systems reviewed and are negative.    Medications: Current Outpatient Medications  Medication Sig Dispense Refill  . losartan (COZAAR) 25 MG tablet Take 1 tablet (25 mg total) by mouth daily. Due for follow up visit 15 tablet 0   No current facility-administered medications for this visit.  No Known Allergies     Objective:  BP 132/86   Pulse 76   Wt 231 lb (104.8 kg)   BMI 34.11 kg/m  General Appearance:  Alert, cooperative, no distress, appropriate for age, obese male                            Head:  Normocephalic, without obvious abnormality                             Eyes:  PERRL, EOM's intact, conjunctiva and cornea clear                             Ears:  TM pearly gray color and semitransparent, external ear canals normal, both ears                            Nose:  Nares symmetrical, mucosa pink                          Throat:  Lips, tongue, and mucosa are moist, pink, and intact; oropharynx clear, uvula midline; good dentition                             Neck:  Supple; symmetrical, trachea midline, no adenopathy; thyroid: no enlargement, symmetric, no tenderness/mass/nodules; no carotid bruit                             Back:  Symmetrical, no curvature, ROM normal                                         Lungs:  Clear to auscultation bilaterally, respirations unlabored                             Heart:  normal rate & regular rhythm, S1 and  S2 normal, no murmurs, rubs, or gallops                     Abdomen:  Soft, non-tender, no mass or organomegaly              Genitourinary:  deferred         Musculoskeletal:  Tone and strength strong and symmetrical, all extremities; trace peripheral edema of b/l lower extremities                     Lymphatic:  No adenopathy             Skin/Hair/Nails:  Skin warm, dry and intact, right cheek there is a pedunculated lesion                    Neurologic:  Alert and oriented x3, no cranial nerve deficits, sensation grossly intact, normal gait and station, no tremor Psych: well-groomed, cooperative, good eye contact, euthymic mood, affect mood-congruent, speech is articulate, and thought processes clear and goal-directed  Procedure: Cryodestruction of skin lesion of right cheek (skin tag vs. Verruca) Indication: cosmesis Risks and benefits of procedures discussed. Verbal consent obtained  Area examined and noted no overlying erythema, induration, or other signs of local skin infection. Using cryospray method, the canister was held in an upright position with the cryotip approximately 1 cm away from the lesion.  Freezing performed using liquid nitrogen until 1 mm rim of frost appeared around the lesion.  Freeze-thaw cycle repeated once.  Follow-up: The patient tolerated the procedure well without complications.  Standard post-procedure care is explained and return precautions are given. Advised to return in 1-2 weeks for repeat cryodestruction if lesion is still present/symptomatic.   No results found for this or any previous visit (from the past 72 hour(s)). No results found.    Assessment and Plan: 57 y.o. male with   .[name] was seen today for annual exam.  Diagnoses and all orders for this visit:  Encounter for annual physical exam -     COMPLETE METABOLIC PANEL WITH GFR -     CBC -     Lipid Panel w/reflex Direct LDL -     PSA  Hypertension goal BP (blood pressure) < 130/80 -      TSH + free T4  Mixed hyperlipidemia -     Lipid Panel w/reflex Direct LDL  Screening PSA (prostate specific antigen) -     PSA  Screening for blood disease -     COMPLETE METABOLIC PANEL WITH GFR -     CBC  Screening for diabetes mellitus -     COMPLETE METABOLIC PANEL WITH GFR  Screening for thyroid disorder -     TSH + free T4  Fatigue, unspecified type -     Testosterone  Acquired skin tag   - Personally reviewed PMH, PSH, PFH, medications, allergies, HM - Age-appropriate cancer screening: colonoscopy UTD, declined DRE, PSA pending - Influenza UTD - Tdap UTD - PHQ2 negative - Fasting labs pending - Added on testosterone/TSH for fatigue and decreased endurance, no additional symptoms of hypogonadism - Counseled on therapeutic lifestyle changes. We discussed 10-yr ASCVD risk score and benefits of cholesterol lowering medication. He declined statin medication   Patient education and anticipatory guidance given Patient agrees with treatment plan Follow-up in 6 months for medication management or sooner as needed if symptoms worsen or fail to improve  Levonne Hubert PA-C

## 2018-05-12 NOTE — Patient Instructions (Addendum)

## 2018-05-13 NOTE — Addendum Note (Signed)
Addended by: Gena FrayUMMINGS, Elson Ulbrich E on: 05/13/2018 01:10 PM   Modules accepted: Orders

## 2018-05-14 LAB — TEST AUTHORIZATION

## 2018-05-14 LAB — COMPLETE METABOLIC PANEL WITH GFR
AG RATIO: 1.5 (calc) (ref 1.0–2.5)
ALKALINE PHOSPHATASE (APISO): 69 U/L (ref 40–115)
ALT: 32 U/L (ref 9–46)
AST: 24 U/L (ref 10–35)
Albumin: 4 g/dL (ref 3.6–5.1)
BILIRUBIN TOTAL: 0.6 mg/dL (ref 0.2–1.2)
BUN: 13 mg/dL (ref 7–25)
CHLORIDE: 104 mmol/L (ref 98–110)
CO2: 28 mmol/L (ref 20–32)
Calcium: 9.7 mg/dL (ref 8.6–10.3)
Creat: 0.76 mg/dL (ref 0.70–1.33)
GFR, EST AFRICAN AMERICAN: 119 mL/min/{1.73_m2} (ref >=60)
GFR, Est Non African American: 103 mL/min/{1.73_m2} (ref >=60)
GLUCOSE: 154 mg/dL — AB (ref 65–99)
Globulin: 2.6 g/dL (calc) (ref 1.9–3.7)
Potassium: 4.3 mmol/L (ref 3.5–5.3)
Sodium: 140 mmol/L (ref 135–146)
TOTAL PROTEIN: 6.6 g/dL (ref 6.1–8.1)

## 2018-05-14 LAB — CBC
HCT: 41.9 % (ref 38.5–50.0)
Hemoglobin: 14.5 g/dL (ref 13.2–17.1)
MCH: 30.6 pg (ref 27.0–33.0)
MCHC: 34.6 g/dL (ref 32.0–36.0)
MCV: 88.4 fL (ref 80.0–100.0)
MPV: 10.7 fL (ref 7.5–12.5)
PLATELETS: 198 10*3/uL (ref 140–400)
RBC: 4.74 10*6/uL (ref 4.20–5.80)
RDW: 12.7 % (ref 11.0–15.0)
WBC: 5.2 10*3/uL (ref 3.8–10.8)

## 2018-05-14 LAB — LIPID PANEL W/REFLEX DIRECT LDL
Cholesterol: 211 mg/dL — ABNORMAL HIGH (ref <200)
HDL: 44 mg/dL (ref >40)
LDL Cholesterol (Calc): 146 mg/dL (calc) — ABNORMAL HIGH
Non-HDL Cholesterol (Calc): 167 mg/dL (calc) — ABNORMAL HIGH (ref <130)
TRIGLYCERIDES: 100 mg/dL (ref <150)
Total CHOL/HDL Ratio: 4.8 (calc) (ref <5.0)

## 2018-05-14 LAB — PSA: PSA: 1.9 ng/mL (ref <=4.0)

## 2018-05-14 LAB — HEMOGLOBIN A1C W/OUT EAG: Hgb A1c MFr Bld: 7.2 % of total Hgb — ABNORMAL HIGH (ref <5.7)

## 2018-05-14 LAB — TESTOSTERONE: TESTOSTERONE: 260 ng/dL (ref 250–827)

## 2018-05-14 LAB — TSH+FREE T4: TSH W/REFLEX TO FT4: 1.49 mIU/L (ref 0.40–4.50)

## 2018-05-18 ENCOUNTER — Encounter: Payer: Self-pay | Admitting: Physician Assistant

## 2018-05-18 DIAGNOSIS — E1165 Type 2 diabetes mellitus with hyperglycemia: Secondary | ICD-10-CM | POA: Insufficient documentation

## 2018-05-18 NOTE — Progress Notes (Signed)
Good morning James Fritz, Unfortunately your A1c did confirm a diagnosis of type 2 diabetes.  We will need to start treating this with medication.  I like to see all of my newly diagnosed diabetes patients in the office for a dedicated visit.  Please get on my schedule for this week.

## 2018-05-24 ENCOUNTER — Ambulatory Visit (INDEPENDENT_AMBULATORY_CARE_PROVIDER_SITE_OTHER): Payer: BLUE CROSS/BLUE SHIELD | Admitting: Physician Assistant

## 2018-05-24 ENCOUNTER — Encounter: Payer: Self-pay | Admitting: Physician Assistant

## 2018-05-24 VITALS — BP 144/82 | HR 73 | Wt 227.0 lb

## 2018-05-24 DIAGNOSIS — I10 Essential (primary) hypertension: Secondary | ICD-10-CM

## 2018-05-24 DIAGNOSIS — E1159 Type 2 diabetes mellitus with other circulatory complications: Secondary | ICD-10-CM | POA: Diagnosis not present

## 2018-05-24 DIAGNOSIS — E119 Type 2 diabetes mellitus without complications: Secondary | ICD-10-CM | POA: Diagnosis not present

## 2018-05-24 DIAGNOSIS — E1165 Type 2 diabetes mellitus with hyperglycemia: Secondary | ICD-10-CM | POA: Diagnosis not present

## 2018-05-24 MED ORDER — LOSARTAN POTASSIUM 25 MG PO TABS: 50.0000 mg | ORAL_TABLET | Freq: Every day | ORAL | 1 refills | Status: DC

## 2018-05-24 NOTE — Progress Notes (Signed)
HPI:                                                                Erian Rosengren is a 57 y.o. male who presents to [name] today for newly diagnosed diabetes  New diagnosis of type 2 diabetes following recent lab work showing hemoglobin A1c level of 7.2.  Patient has a family history of diabetes in both of his parents.  He reports that he purchased a glucometer and has been monitoring his nonfasting blood glucose at home which has ranged from 120s to 170s.  Denies polydipsia, polyphagia, polyuria.  Denies numbness, tingling, altered sensation in his extremities.  Denies ulcers or wounds on his feet.   Past Medical History:  Diagnosis Date  . Colon polyps   . CVA (cerebral vascular accident) (HCC)   . Hydrocephalus (HCC)   . Hypertension   . Mixed hyperlipidemia 06/22/2017   10-yr ASCVD risk 7%  . SAH (subarachnoid hemorrhage) (HCC) 2010   Past Surgical History:  Procedure Laterality Date  . COLONOSCOPY W/ POLYPECTOMY    . CSF SHUNT    . VASECTOMY     Social History   Tobacco Use  . Smoking status: Former Smoker    Last attempt to quit: 06/23/1992    Years since quitting: 25.9  . Smokeless tobacco: Never Used  Substance Use Topics  . Alcohol use: Yes    Alcohol/week: 3.0 - 5.0 standard drinks    Types: 3 - 5 Standard drinks or equivalent per week   family history includes Diabetes in his father and mother; Heart disease in his brother; Hypertension in his brother and father; Schizophrenia in his sister.    ROS: negative except as noted in the HPI  Medications: Current Outpatient Medications  Medication Sig Dispense Refill  . losartan (COZAAR) 25 MG tablet Take 2 tablets (50 mg total) by mouth daily. Due for follow up visit 180 tablet 1   No current facility-administered medications for this visit.    No Known Allergies     Objective:  BP (!) 144/82   Pulse 73   Wt 227 lb (103 kg)   BMI 33.52 kg/m  Gen:   alert, not ill-appearing, no distress, appropriate for age, obese male HEENT: head normocephalic without obvious abnormality, conjunctiva and cornea clear, trachea midline Pulm: Normal work of breathing, normal phonation Neuro: alert and oriented x 3, no tremor MSK: extremities atraumatic, normal gait and station Skin: intact, no rashes on exposed skin, no jaundice, no cyanosis Psych: well-groomed, cooperative, good eye contact, euthymic mood, affect mood-congruent, speech is articulate, and thought processes clear and goal-directed  Diabetic Foot Exam - Simple   Simple Foot Form Diabetic Foot exam was performed with the following findings:  Yes 05/24/2018  8:41 AM  Visual Inspection No deformities, no ulcerations, no other skin breakdown bilaterally:  Yes Sensation Testing Intact to touch and monofilament testing bilaterally:  Yes Pulse Check Posterior Tibialis and Dorsalis pulse intact bilaterally:  Yes Comments      Lab Results  Component Value Date   CREATININE 0.76 05/12/2018   BUN 13 05/12/2018   NA 140 05/12/2018   K 4.3 05/12/2018   CL 104 05/12/2018   CO2 28 05/12/2018  Lab Results  Component Value Date   HGBA1C 7.2 (H) 05/12/2018   The 10-year ASCVD risk score [name], et al., 2013) is: 17.5%   Values used to calculate the score:     Age: [age] years     Sex: Male     Is Non-Hispanic African American: No     Diabetic: Yes     Tobacco smoker: No     Systolic Blood Pressure: 144 mmHg     Is BP treated: Yes     HDL Cholesterol: 44 mg/dL     Total Cholesterol: 211 mg/dL   No results found for this or any previous visit (from the past 72 hour(s)). No results found.    Assessment and Plan: 57 y.o. male with   .Jomarie LongsJoseph was seen today for diabetes mellitus.  Diagnoses and all orders for this visit:  Newly diagnosed diabetes (HCC) -     Ambulatory referral to diabetic education  Hypertension goal BP (blood pressure) < 130/80  Type 2 diabetes  mellitus with hyperglycemia, without long-term current use of insulin (HCC) -     Ambulatory referral to diabetic education  Hypertension associated with diabetes (HCC) -     losartan (COZAAR) 25 MG tablet; Take 2 tablets (50 mg total) by mouth daily. Due for follow up visit   Patient counseled today on the pathophysiology, preventive care and treatment of type 2 diabetes. Patients questions answered. Specifically we discussed the following Importance of annual diabetic eye exam for prevention of diabetic retinopathy Importance of annual foot exam and foot care for prevention of neuropathy and diabetic foot ulcers LDL goal less than 70 to reduce cardiovascular risk.  We discussed that diabetes is the #1 risk factor for heart attack.  Patient's 10-year cardiovascular risk is 17.2%.  Explained recommendation for moderate intensity statin therapy.  Patient declined at this time and would like to manage with aggressive lifestyle changes Blood pressure goal less than 130/80. BP out of range on 2 office checks today.  Increasing losartan to 50 mg daily.  Also recommend baby aspirin for primary prevention ACE/ARB for prevention of microalbuminuria and diabetic nephropathy Referral placed to diabetes educator.  Since he declined medication today I do recommend follow-up with the nutritionist to assist him with the dietary changes necessary to control his diabetes  Patient education and anticipatory guidance given Patient agrees with treatment plan Follow-up in 2 to 3 months or sooner as needed if symptoms worsen or fail to improve  I spent 25 minutes with this patient, greater than 50% was face-to-face time counseling regarding the above diagnoses  Levonne Hubertharley E. Kaden Dunkel PA-C

## 2018-05-24 NOTE — Patient Instructions (Signed)
Diabetes Preventive Care: - annual foot exam  - annual dilated eye exam with an eye doctor - self foot exams at least weekly - pneumonia vaccine once (booster in 5 years and at age 57) - annual influenza vaccine - twice yearly dental cleanings and yearly exam - goal blood pressure <130/80 - LDL cholesterol <70 - A1C <7.8<7.0 - body mass index (BMI) <27.0 - follow-up every 3 months if your A1C is not at goal - follow-up every 6 months if diabetes is well controlled

## 2018-08-16 ENCOUNTER — Ambulatory Visit (INDEPENDENT_AMBULATORY_CARE_PROVIDER_SITE_OTHER): Payer: BLUE CROSS/BLUE SHIELD | Admitting: Physician Assistant

## 2018-08-16 ENCOUNTER — Encounter: Payer: Self-pay | Admitting: Physician Assistant

## 2018-08-16 VITALS — BP 135/87 | HR 78 | Wt 221.0 lb

## 2018-08-16 DIAGNOSIS — E1165 Type 2 diabetes mellitus with hyperglycemia: Secondary | ICD-10-CM

## 2018-08-16 DIAGNOSIS — E1159 Type 2 diabetes mellitus with other circulatory complications: Secondary | ICD-10-CM

## 2018-08-16 DIAGNOSIS — I10 Essential (primary) hypertension: Secondary | ICD-10-CM | POA: Diagnosis not present

## 2018-08-16 DIAGNOSIS — Z5320 Procedure and treatment not carried out because of patient's decision for unspecified reasons: Secondary | ICD-10-CM | POA: Diagnosis not present

## 2018-08-16 LAB — POCT GLYCOSYLATED HEMOGLOBIN (HGB A1C): HbA1c, POC (controlled diabetic range): 7.2 % — AB (ref 0.0–7.0)

## 2018-08-16 MED ORDER — ONETOUCH ULTRASOFT LANCETS MISC: 12 refills | Status: DC

## 2018-08-16 MED ORDER — METFORMIN HCL ER 500 MG PO TB24: ORAL_TABLET | ORAL | 1 refills | Status: DC

## 2018-08-16 NOTE — Progress Notes (Signed)
HPI:                                                                James Fritz is a 58 y.o. male who presents to [place]: [facility] today for diabetes / HTN follow-up  New diagnosis of type 2 diabetes approx 3 months ago with hemoglobin A1c level of 7.2.   He declined medication management and opted to manage with aggressive lifestyle changes. He met with the diabetes educator. He states he has cut out most processed sugar. He has lost approx 6 pounds. He is not currently exercising. No outside blood glucose readings to report today. Denies polydipsia, polyphagia, polyuria.  Denies numbness, tingling, altered sensation in his extremities.  Denies ulcers or wounds on his feet.  HTN: Losartan was increased to 50 mg QD. Compliant with medications. Does not check BP's at home. Denies vision change, headache, chest pain with exertion, orthopnea, lightheadedness, syncope and edema. Risk factors include: DM2, male sex, age>39, family hx  Depression screen [facility] 2/9 08/16/2018 05/12/2018 05/08/2017 12/16/2016  Decreased Interest 0 0 0 0  Down, Depressed, Hopeless 1 0 0 0  PHQ - 2 Score 1 0 0 0  Altered sleeping 1 0 - -  Tired, decreased energy 1 2 - -  Change in appetite 2 1 - -  Feeling bad or failure about yourself  1 0 - -  Trouble concentrating 0 1 - -  Moving slowly or fidgety/restless 1 0 - -  Suicidal thoughts 0 0 - -  PHQ-9 Score 7 4 - -   GAD 7 : Generalized Anxiety Score 08/16/2018 05/12/2018  Nervous, Anxious, on Edge 1 0  Control/stop worrying 1 0  Worry too much - different things 1 1  Trouble relaxing 1 1  Restless 1 0  Easily annoyed or irritable 1 0  Afraid - awful might happen 0 0  Total GAD 7 Score 6 2  Anxiety Difficulty Somewhat difficult -      Past Medical History:  Diagnosis Date  . Colon polyps   . CVA (cerebral vascular accident) ([place])   . Hydrocephalus ([place])   . Hypertension   . Mixed hyperlipidemia 06/22/2017    10-yr ASCVD risk 7%  . SAH (subarachnoid hemorrhage) ([place]) 2010   Past Surgical History:  Procedure Laterality Date  . COLONOSCOPY W/ POLYPECTOMY    . CSF SHUNT    . VASECTOMY     Social History   Tobacco Use  . Smoking status: Former Smoker    Last attempt to quit: 06/23/1992    Years since quitting: 26.1  . Smokeless tobacco: Never Used  Substance Use Topics  . Alcohol use: Yes    Alcohol/week: 3.0 - 5.0 standard drinks    Types: 3 - 5 Standard drinks or equivalent per week   family history includes Diabetes in his father and mother; Heart disease in his brother; Hypertension in his brother and father; Schizophrenia in his sister.    ROS: negative except as noted in the HPI  Medications: Current Outpatient Medications  Medication Sig Dispense Refill  . losartan (COZAAR) 25 MG tablet Take 2 tablets (50 mg total) by mouth daily. Due for follow up visit 180 tablet 1  . Lancets [facility]  ULTRASOFT) lancets Use as instructed 100 each 12   No current facility-administered medications for this visit.    No Known Allergies     Objective:  BP 135/87   Pulse 78   Wt 221 lb (100.2 kg)   BMI 32.64 kg/m  Gen:  alert, not ill-appearing, no distress, appropriate for age, obese male HEENT: head normocephalic without obvious abnormality, conjunctiva and cornea clear, trachea midline Pulm: Normal work of breathing, normal phonation Neuro: alert and oriented x 3, no tremor MSK: extremities atraumatic, normal gait and station, no peripheral edema  Skin: intact, no rashes on exposed skin, no jaundice, no cyanosis Psych: well-groomed, cooperative, good eye contact, euthymic mood, affect mood-congruent, speech is articulate, and thought processes clear and goal-directed  Wt Readings from Last 3 Encounters:  08/16/18 221 lb (100.2 kg)  05/24/18 227 lb (103 kg)  05/12/18 231 lb (104.8 kg)     Lab Results  Component Value Date   CREATININE 0.76 05/12/2018   BUN 13 05/12/2018    NA 140 05/12/2018   K 4.3 05/12/2018   CL 104 05/12/2018   CO2 28 05/12/2018     Lab Results  Component Value Date   HGBA1C 7.2 (H) 05/12/2018    The 10-year ASCVD risk score Mikey Bussing DC Jr., et al., 2013) is: 17.1%   Values used to calculate the score:     Age: 73 years     Sex: Male     Is Non-Hispanic African American: No     Diabetic: Yes     Tobacco smoker: No     Systolic Blood Pressure: 482 mmHg     Is BP treated: Yes     HDL Cholesterol: 44 mg/dL     Total Cholesterol: 211 mg/dL   No results found for this or any previous visit (from the past 72 hour(s)). No results found.    Assessment and Plan: 58 y.o. male with   .Omega was seen today for hyperglycemia and medication management.  Diagnoses and all orders for this visit:  Type 2 diabetes mellitus with hyperglycemia, without long-term current use of insulin (HCC) -     POCT HgB A1C -     Lancets (ONETOUCH ULTRASOFT) lancets; Use as instructed  Statin declined   No change in A1C today Starting Metformin XR 500 mg, self-titrate to 1000 mg daily Plans to have eye exam within the next month BP goal <130/80, cont Losartan 50 mg QD Encouraged increased aerobic exercise and weight loss  At last OV, we discussed that diabetes is the #1 risk factor for heart attack.  Patient's 10-year cardiovascular risk is 17.2%.  Explained recommendation for moderate intensity statin therapy.  Patient continues to decline statin therapy  Patient education and anticipatory guidance given Patient agrees with treatment plan Follow-up in 3 months or sooner as needed if symptoms worsen or fail to improve   Darlyne Russian PA-C

## 2018-08-16 NOTE — Patient Instructions (Signed)
Diabetes Preventive Care: - annual foot exam  - annual dilated eye exam with an eye doctor - self foot exams at least weekly - pneumonia vaccine once (booster in 5 years and at age 58) - annual influenza vaccine - twice yearly dental cleanings and yearly exam - goal blood pressure <140/90, ideally <130/80 - LDL cholesterol <70 - A1C <8.6 - body mass index (BMI) <25.0 - follow-up every 3 months if your A1C is not at goal - follow-up every 6 months if diabetes is well controlled   Diabetes Mellitus and Exercise Exercising regularly is important for your overall health, especially when you have diabetes (diabetes mellitus). Exercising is not only about losing weight. It has many other health benefits, such as increasing muscle strength and bone density and reducing body fat and stress. This leads to improved fitness, flexibility, and endurance, all of which result in better overall health. Exercise has additional benefits for people with diabetes, including:  Reducing appetite.  Helping to lower and control blood glucose.  Lowering blood pressure.  Helping to control amounts of fatty substances (lipids) in the blood, such as cholesterol and triglycerides.  Helping the body to respond better to insulin (improving insulin sensitivity).  Reducing how much insulin the body needs.  Decreasing the risk for heart disease by: ? Lowering cholesterol and triglyceride levels. ? Increasing the levels of good cholesterol. ? Lowering blood glucose levels. What is my activity plan? Your health care provider or certified diabetes educator can help you make a plan for the type and frequency of exercise (activity plan) that works for you. Make sure that you:  Do at least 150 minutes of moderate-intensity or vigorous-intensity exercise each week. This could be brisk walking, biking, or water aerobics. ? Do stretching and strength exercises, such as yoga or weightlifting, at least 2 times a  week. ? Spread out your activity over at least 3 days of the week.  Get some form of physical activity every day. ? Do not go more than 2 days in a row without some kind of physical activity. ? Avoid being inactive for more than 30 minutes at a time. Take frequent breaks to walk or stretch.  Choose a type of exercise or activity that you enjoy, and set realistic goals.  Start slowly, and gradually increase the intensity of your exercise over time. What do I need to know about managing my diabetes?   Check your blood glucose before and after exercising. ? If your blood glucose is 240 mg/dL (77.3 mmol/L) or higher before you exercise, check your urine for ketones. If you have ketones in your urine, do not exercise until your blood glucose returns to normal. ? If your blood glucose is 100 mg/dL (5.6 mmol/L) or lower, eat a snack containing 15-20 grams of carbohydrate. Check your blood glucose 15 minutes after the snack to make sure that your level is above 100 mg/dL (5.6 mmol/L) before you start your exercise.  Know the symptoms of low blood glucose (hypoglycemia) and how to treat it. Your risk for hypoglycemia increases during and after exercise. Common symptoms of hypoglycemia can include: ? Hunger. ? Anxiety. ? Sweating and feeling clammy. ? Confusion. ? Dizziness or feeling light-headed. ? Increased heart rate or palpitations. ? Blurry vision. ? Tingling or numbness around the mouth, lips, or tongue. ? Tremors or shakes. ? Irritability.  Keep a rapid-acting carbohydrate snack available before, during, and after exercise to help prevent or treat hypoglycemia.  Avoid injecting insulin into areas  of the body that are going to be exercised. For example, avoid injecting insulin into: ? The arms, when playing tennis. ? The legs, when jogging.  Keep records of your exercise habits. Doing this can help you and your health care provider adjust your diabetes management plan as needed. Write  down: ? Food that you eat before and after you exercise. ? Blood glucose levels before and after you exercise. ? The type and amount of exercise you have done. ? When your insulin is expected to peak, if you use insulin. Avoid exercising at times when your insulin is peaking.  When you start a new exercise or activity, work with your health care provider to make sure the activity is safe for you, and to adjust your insulin, medicines, or food intake as needed.  Drink plenty of water while you exercise to prevent dehydration or heat stroke. Drink enough fluid to keep your urine clear or pale yellow. Summary  Exercising regularly is important for your overall health, especially when you have diabetes (diabetes mellitus).  Exercising has many health benefits, such as increasing muscle strength and bone density and reducing body fat and stress.  Your health care provider or certified diabetes educator can help you make a plan for the type and frequency of exercise (activity plan) that works for you.  When you start a new exercise or activity, work with your health care provider to make sure the activity is safe for you, and to adjust your insulin, medicines, or food intake as needed. This information is not intended to replace advice given to you by your health care provider. Make sure you discuss any questions you have with your health care provider. Document Released: 08/30/2003 Document Revised: 12/18/2016 Document Reviewed: 11/19/2015 Elsevier Interactive Patient Education  2019 [employer].

## 2018-08-23 LAB — HM DIABETES EYE EXAM

## 2018-11-09 ENCOUNTER — Ambulatory Visit: Payer: Self-pay | Admitting: Physician Assistant

## 2018-11-12 ENCOUNTER — Encounter: Payer: Self-pay | Admitting: Family Medicine

## 2018-11-12 ENCOUNTER — Ambulatory Visit (INDEPENDENT_AMBULATORY_CARE_PROVIDER_SITE_OTHER): Payer: BLUE CROSS/BLUE SHIELD | Admitting: Family Medicine

## 2018-11-12 VITALS — BP 154/92 | HR 66 | Ht 66.0 in | Wt 226.0 lb

## 2018-11-12 DIAGNOSIS — M5412 Radiculopathy, cervical region: Secondary | ICD-10-CM

## 2018-11-12 DIAGNOSIS — M62838 Other muscle spasm: Secondary | ICD-10-CM | POA: Diagnosis not present

## 2018-11-12 MED ORDER — GABAPENTIN 300 MG PO CAPS: ORAL_CAPSULE | ORAL | 3 refills | Status: DC

## 2018-11-12 MED ORDER — PREDNISONE 10 MG PO TABS: 30.0000 mg | ORAL_TABLET | Freq: Every day | ORAL | 0 refills | Status: DC
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
## 2018-11-12 NOTE — Patient Instructions (Signed)
Thank you for coming in today. Come back or go to the emergency room if you notice new weakness new numbness problems walking or bowel or bladder problems.  Attend physical therapy. Use a heating pad Continue tylenol and ibuprofen or aleve for pain.  Try gabapentin for nerve pain.   Recheck if worsening or not improving.     Cervical Radiculopathy  Cervical radiculopathy happens when a nerve in the neck (cervical nerve) is pinched or bruised. This condition can develop because of an injury or as part of the normal aging process. Pressure on the cervical nerves can cause pain or numbness that runs from the neck all the way down into the arm and fingers. Usually, this condition gets better with rest. Treatment may be needed if the condition does not improve. What are the causes? This condition may be caused by:  Injury.  Slipped (herniated) disk.  Muscle tightness in the neck because of overuse.  Arthritis.  Breakdown or degeneration in the bones and joints of the spine (spondylosis) due to aging.  Bone spurs that may develop near the cervical nerves. What are the signs or symptoms? Symptoms of this condition include:  Pain that runs from the neck to the arm and hand. The pain can be severe or irritating. It may be worse when the neck is moved.  Numbness or weakness in the affected arm and hand. How is this diagnosed? This condition may be diagnosed based on symptoms, medical history, and a physical exam. You may also have tests, including:  X-rays.  CT scan.  MRI.  Electromyogram (EMG).  Nerve conduction tests. How is this treated? In many cases, treatment is not needed for this condition. With rest, the condition usually gets better over time. If treatment is needed, options may include:  Wearing a soft neck collar for short periods of time.  Physical therapy to strengthen your neck muscles.  Medicines, such as NSAIDs, oral corticosteroids, or spinal injections.   Surgery. This may be needed if other treatments do not help. Various types of surgery may be done depending on the cause of your problems. Follow these instructions at home: Managing pain  Take over-the-counter and prescription medicines only as told by your health care provider.  If directed, apply ice to the affected area. ? Put ice in a plastic bag. ? Place a towel between your skin and the bag. ? Leave the ice on for 20 minutes, 2-3 times per day.  If ice does not help, you can try using heat. Take a warm shower or warm bath, or use a heat pack as told by your health care provider.  Try a gentle neck and shoulder massage to help relieve symptoms. Activity  Rest as needed. Follow instructions from your health care provider about any restrictions on activities.  Do stretching and strengthening exercises as told by your health care provider or physical therapist. General instructions  If you were given a soft collar, wear it as told by your health care provider.  Use a flat pillow when you sleep.  Keep all follow-up visits as told by your health care provider. This is important. Contact a health care provider if:  Your condition does not improve with treatment. Get help right away if:  Your pain gets much worse and cannot be controlled with medicines.  You have weakness or numbness in your hand, arm, face, or leg.  You have a high fever.  You have a stiff, rigid neck.  You lose control  of your bowels or your bladder (have incontinence).  You have trouble with walking, balance, or speaking. This information is not intended to replace advice given to you by your health care provider. Make sure you discuss any questions you have with your health care provider. Document Released: 03/04/2001 Document Revised: 11/15/2015 Document Reviewed: 08/03/2014 Elsevier Interactive Patient Education  [employer]2019 Elsevier Inc.

## 2018-11-16 ENCOUNTER — Encounter: Payer: Self-pay | Admitting: Rehabilitative and Restorative Service Providers"

## 2018-11-16 ENCOUNTER — Other Ambulatory Visit: Payer: Self-pay

## 2018-11-16 ENCOUNTER — Ambulatory Visit (INDEPENDENT_AMBULATORY_CARE_PROVIDER_SITE_OTHER): Payer: BC Managed Care – PPO | Admitting: Rehabilitative and Restorative Service Providers"

## 2018-11-16 DIAGNOSIS — M542 Cervicalgia: Secondary | ICD-10-CM | POA: Diagnosis not present

## 2018-11-16 DIAGNOSIS — R293 Abnormal posture: Secondary | ICD-10-CM

## 2018-11-16 DIAGNOSIS — R29898 Other symptoms and signs involving the musculoskeletal system: Secondary | ICD-10-CM | POA: Diagnosis not present

## 2018-11-16 DIAGNOSIS — M79601 Pain in right arm: Secondary | ICD-10-CM
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
## 2018-11-16 NOTE — Patient Instructions (Signed)
Access Code: [email or web address]  URL: [email or web address]  Date: 11/16/2018  Prepared by: Corlis Leak   Exercises  Seated Cervical Retraction - 10 reps - 1 sets - 3x daily - 7x weekly  Standing Scapular Retraction - 10 reps - 1 sets - 10 hold - 3x daily - 7x weekly  Shoulder External Rotation and Scapular Retraction - 10 reps - 1 sets - hold - 3x daily - 7x weekly  Standing Scapular Retraction in Abduction - 10 reps - 1 sets - 3x daily - 7x weekly  Doorway Pec Stretch at 60 Degrees Abduction - 3 reps - 1 sets - 3x daily - 7x weekly  Doorway Pec Stretch at 90 Degrees Abduction - 3 reps - 1 sets - 30 seconds hold - 3x daily - 7x weekly  Doorway Pec Stretch at 120 Degrees Abduction - 3 reps - 1 sets - 30 second hold hold - 3x daily - 7x weekly

## 2018-11-17 ENCOUNTER — Encounter: Payer: Self-pay | Admitting: Physician Assistant

## 2018-11-18 ENCOUNTER — Other Ambulatory Visit: Payer: Self-pay

## 2018-11-18 ENCOUNTER — Ambulatory Visit (INDEPENDENT_AMBULATORY_CARE_PROVIDER_SITE_OTHER): Payer: BC Managed Care – PPO | Admitting: Physical Therapy

## 2018-11-18 DIAGNOSIS — M542 Cervicalgia: Secondary | ICD-10-CM | POA: Diagnosis not present

## 2018-11-18 DIAGNOSIS — R293 Abnormal posture: Secondary | ICD-10-CM | POA: Diagnosis not present

## 2018-11-18 DIAGNOSIS — M79601 Pain in right arm: Secondary | ICD-10-CM | POA: Diagnosis not present

## 2018-11-18 DIAGNOSIS — R29898 Other symptoms and signs involving the musculoskeletal system: Secondary | ICD-10-CM | POA: Diagnosis not present

## 2018-11-18 NOTE — Therapy (Signed)
[facility] [address] [place], [place], [address] Phone: [phone]   Fax:  [phone]  Physical Therapy Treatment  Patient Details  Name: James MurdochJoseph Fritz MRN: [id number] Date of Birth: 12/31/1960 Referring Provider (PT): Dr Denyse Amassorey   Encounter Date: 11/18/2018  PT End of Session - 11/18/18 1556    Visit Number  2    Number of Visits  12    Date for PT Re-Evaluation  12/28/18    PT Start Time  1558    PT Stop Time  1653    PT Time Calculation (min)  55 min    Activity Tolerance  Patient tolerated treatment well    Behavior During Therapy  [facility] for tasks assessed/performed       Past Medical History:  Diagnosis Date  . Colon polyps   . CVA (cerebral vascular accident) (HCC)   . Hydrocephalus (HCC)   . Hypertension   . Mixed hyperlipidemia 06/22/2017   10-yr ASCVD risk 7%  . SAH (subarachnoid hemorrhage) (HCC) 2010    Past Surgical History:  Procedure Laterality Date  . COLONOSCOPY W/ POLYPECTOMY    . CSF SHUNT    . VASECTOMY      There were no vitals filed for this visit.  Subjective Assessment - 11/18/18 1602    Subjective  "Whatever she did last time helped".  Pt reports his radicular symptoms are decreasing in intensity and occurance.  He is pleased with progress so far.     Currently in Pain?  Yes    Pain Score  4     Pain Location  Neck    Pain Orientation  Right    Pain Descriptors / Indicators  Tightness    Aggravating Factors   continuous use     Pain Relieving Factors  rest          [facility] PT Assessment - 11/18/18 0001      Assessment   Medical Diagnosis  Cervical dysfunction; Rt UE pain     Referring Provider (PT)  Dr Denyse Amassorey    Onset Date/Surgical Date  11/06/18    Hand Dominance  Right    Next MD Visit  PRN    Prior Therapy  for CVA; gamekeepers thumb       [facility] Adult PT Treatment/Exercise - 11/18/18 0001      Self-Care   Self-Care  Other Self-Care Comments    Other Self-Care  Comments   Pt educated in self massage with ball to pecs and periscapular musculature; pt returned demo with cues.       Exercises   Exercises  Neck      Neck Exercises: Seated   Neck Retraction  10 reps;3 secs   tactile cues and demo for improved form.    Other Seated Exercise  W's and L's x 5 sec hold x 10 reps each    Other Seated Exercise  scap retraction x 5 sec x 5 reps;  thoracic ext over back of chair x 3 reps of 10 sec (hands supporting head)       Shoulder Exercises: Pulleys   Flexion  3 minutes    Scaption  1 minute      Shoulder Exercises: Stretch   Other Shoulder Stretches  3 way doorway 20 sec x 2 each position     Other Shoulder Stretches  lat stretch with arms resting on truck x 10 sec x 2 reps       Electrical Stimulation  Electrical Stimulation Location  Rt cervical and shoulder girdle area     Electrical Stimulation Action  IFC    Electrical Stimulation Parameters  to pt tolerance    Electrical Stimulation Goals  Pain;Tone      Manual Therapy   Manual Therapy  Manual Traction;Soft tissue mobilization;Myofascial release    Manual therapy comments  pt supine    Soft tissue mobilization  STM to Rt levator, scalene, upper trap, pec maj, bilat cspine paraspinals    Myofascial Release  suboccipital release     Manual Traction  gentle cervical traction x 10-15 sec x 5 reps       Neck Exercises: Stretches   Upper Trapezius Stretch  Left;2 reps;20 seconds    Levator Stretch  Left;3 reps;10 seconds    Other Neck Stretches  trial of median nerve stretch in standing, flossing                   PT Long Term Goals - 11/16/18 1353      PT LONG TERM GOAL #1   Title  Improve posture and alignment with patient to demonstrate improved upright posture with posterior shoulder girdle musculature engaged 12/28/2018    Time  6    Period  Weeks    Status  New      PT LONG TERM GOAL #2   Title  Decrease pain and radicular symptoms into Rt UE by 50-75% allowing  patient to use UE's for functional activities 12/28/2018    Time  6    Period  Weeks    Status  New      PT LONG TERM GOAL #3   Title  Increase cervical extension and lateral flexion by 5-8 degrees 12/28/2018    Time  6    Period  Weeks    Status  New      PT LONG TERM GOAL #4   Title  Independent in HEp 12/28/2018    Time  6    Period  Weeks    Status  New      PT LONG TERM GOAL #5   Title  Improve FOTO to </= 30% limitation 12/28/2018    Time  6    Period  Weeks    Status  New            Plan - 11/18/18 1647    Clinical Impression Statement  Pt had positive response to last session and HEP, reporting overall decrease in pain. Pt required only minor cues for form of exercises.  Encouraged pt to avoid stomach sleeping at this time.  Pt reported reduction in pain after manual therapy and exercises.  Progressing towards goals.     Rehab Potential  Good    PT Frequency  2x / week    PT Duration  6 weeks    PT Treatment/Interventions  Patient/family education;ADLs/Self Care Home Management;Cryotherapy;Electrical Stimulation;Iontophoresis 4mg /ml Dexamethasone;Moist Heat;Ultrasound;Therapeutic activities;Therapeutic exercise;Neuromuscular re-education;Manual techniques;Dry needling;Taping    PT Next Visit Plan  manual therapy; progress postural strengthening/ stretches.      PT Home Exercise Plan  Access Code: [email or web address]     Consulted and Agree with Plan of Care  Patient       Patient will benefit from skilled therapeutic intervention in order to improve the following deficits and impairments:  Postural dysfunction, Improper body mechanics, Pain, Increased fascial restricitons, Increased muscle spasms, Hypomobility, Decreased range of motion, Decreased mobility, Decreased activity tolerance  Visit Diagnosis: Cervicalgia  Pain in right  arm  Other symptoms and signs involving the musculoskeletal system  Abnormal posture     Problem List Patient Active Problem List   Diagnosis  Date Noted  . Statin declined 08/16/2018  . Type 2 diabetes mellitus with hyperglycemia (HCC) 05/18/2018  . Mild peripheral edema 05/12/2018  . Fatigue 05/12/2018  . Mixed hyperlipidemia 06/22/2017  . Corn of foot 05/12/2017  . Class 1 obesity due to excess calories with serious comorbidity and body mass index (BMI) of 32.0 to 32.9 in adult 05/08/2017  . Wears contact lenses 05/08/2017  . Hypertension associated with diabetes (HCC) 12/21/2016  . History of CVA (cerebrovascular accident) 12/16/2016  . Hydrocephalus with operating shunt [facility]) 12/16/2016   Mayer Camel, PTA 11/18/18 5:04 PM  [facility] [place] [address] [place], [place], [address] Phone: [phone]   Fax:  [phone]  Name: Derral Colucci MRN: [id number] Date of Birth: [birth year]

## 2018-11-22 ENCOUNTER — Ambulatory Visit (INDEPENDENT_AMBULATORY_CARE_PROVIDER_SITE_OTHER): Payer: BC Managed Care – PPO | Admitting: Rehabilitative and Restorative Service Providers"

## 2018-11-22 ENCOUNTER — Other Ambulatory Visit: Payer: Self-pay

## 2018-11-22 ENCOUNTER — Encounter: Payer: Self-pay | Admitting: Rehabilitative and Restorative Service Providers"

## 2018-11-22 DIAGNOSIS — M79601 Pain in right arm: Secondary | ICD-10-CM | POA: Diagnosis not present

## 2018-11-22 DIAGNOSIS — M542 Cervicalgia: Secondary | ICD-10-CM | POA: Diagnosis not present

## 2018-11-22 DIAGNOSIS — R293 Abnormal posture: Secondary | ICD-10-CM | POA: Diagnosis not present

## 2018-11-22 DIAGNOSIS — R29898 Other symptoms and signs involving the musculoskeletal system: Secondary | ICD-10-CM | POA: Diagnosis not present

## 2018-11-22 NOTE — Therapy (Signed)
[facility] [address] [place], [place], [address] Phone: [phone]   Fax:  [phone]  Physical Therapy Treatment  Patient Details  Name: James MurdochJoseph Fritz MRN: [id number] Date of Birth: 06/07/1961 Referring Provider (PT): Dr Denyse Amassorey   Encounter Date: 11/22/2018  PT End of Session - 11/22/18 1553    Visit Number  3    Number of Visits  12    Date for PT Re-Evaluation  12/28/18    PT Start Time  1555    PT Stop Time  1650    PT Time Calculation (min)  55 min    Activity Tolerance  Patient tolerated treatment well       Past Medical History:  Diagnosis Date  . Colon polyps   . CVA (cerebral vascular accident) (HCC)   . Hydrocephalus (HCC)   . Hypertension   . Mixed hyperlipidemia 06/22/2017   10-yr ASCVD risk 7%  . SAH (subarachnoid hemorrhage) (HCC) 2010    Past Surgical History:  Procedure Laterality Date  . COLONOSCOPY W/ POLYPECTOMY    . CSF SHUNT    . VASECTOMY      There were no vitals filed for this visit.  Subjective Assessment - 11/22/18 1557    Subjective  Patient reports that he is pain free. He only has a little bit of tightness. Played tennis this weekend. He has continued to do his exercises at home.     Currently in Pain?  No/denies                       [facility] Adult PT Treatment/Exercise - 11/22/18 0001      Neck Exercises: Machines for Strengthening   UBE (Upper Arm Bike)  L2 x 4 min alt fwd/back       Neck Exercises: Supine   Other Supine Exercise  prolonged snow angel w/ noodle 2-3 min; horizontal noodle mid thoracic spine 1-2 min       Shoulder Exercises: Standing   Extension  Strengthening;Both;10 reps;Theraband    Theraband Level (Shoulder Extension)  Level 3 (Green)    Row  Strengthening;Both;10 reps;Theraband    Theraband Level (Shoulder Row)  Level 3 (Green)    Row Limitations  bow and arrow 10 each LE green TB     Retraction  Strengthening;Both;10  reps;Theraband    Theraband Level (Shoulder Retraction)  Level 3 (Green)    Other Standing Exercises  isometric ER green TB 15 reps 3-4 sec hold     Other Standing Exercises  working on UE movement w/ scap stabilized w/ noodle for tactile stim      Shoulder Exercises: Stretch   Other Shoulder Stretches  3 way doorway 20 sec x 2 each position     Other Shoulder Stretches  trunk rotation in supine 20-30 secx 2 each side       Electrical Stimulation   Electrical Stimulation Location  Rt cervical and shoulder girdle area     Electrical Stimulation Action  IFC    Electrical Stimulation Parameters  to tolerance    Electrical Stimulation Goals  Pain;Tone      Manual Therapy   Manual therapy comments  pt supine    Joint Mobilization  working through anterior chest/ribs w/ rib springing    Soft tissue mobilization  STM to Rt levator, scalene, upper trap, pec maj, bilat cspine paraspinals             PT Education - 11/22/18 1627  Education Details  HEP     Person(s) Educated  Patient    Methods  Explanation;Demonstration;Tactile cues;Verbal cues;Handout    Comprehension  Verbalized understanding;Returned demonstration;Verbal cues required;Tactile cues required          PT Long Term Goals - 11/16/18 1353      PT LONG TERM GOAL #1   Title  Improve posture and alignment with patient to demonstrate improved upright posture with posterior shoulder girdle musculature engaged 12/28/2018    Time  6    Period  Weeks    Status  New      PT LONG TERM GOAL #2   Title  Decrease pain and radicular symptoms into Rt UE by 50-75% allowing patient to use UE's for functional activities 12/28/2018    Time  6    Period  Weeks    Status  New      PT LONG TERM GOAL #3   Title  Increase cervical extension and lateral flexion by 5-8 degrees 12/28/2018    Time  6    Period  Weeks    Status  New      PT LONG TERM GOAL #4   Title  Independent in HEp 12/28/2018    Time  6    Period  Weeks    Status   New      PT LONG TERM GOAL #5   Title  Improve FOTO to </= 30% limitation 12/28/2018    Time  6    Period  Weeks    Status  New            Plan - 11/22/18 1553    Clinical Impression Statement  Excellent progress with resolution of radicular symptoms and pain - has some continued stiffness, Added exercises without difficulty - working on thoracic extensin and mobility as wel las posterior shoulder girdle stabilization/strengthening. Progressing well toward stated goals of therapy.     Rehab Potential  Good    PT Frequency  2x / week    PT Duration  6 weeks    PT Treatment/Interventions  Patient/family education;ADLs/Self Care Home Management;Cryotherapy;Electrical Stimulation;Iontophoresis 4mg /ml Dexamethasone;Moist Heat;Ultrasound;Therapeutic activities;Therapeutic exercise;Neuromuscular re-education;Manual techniques;Dry needling;Taping    PT Next Visit Plan  manual therapy as indicated; progress postural strengthening/stretches - scapular depression/wall stretch into horizontal abduction; row 45 deg     PT Home Exercise Plan  Access Code: [email or web address]     Consulted and Agree with Plan of Care  Patient       Patient will benefit from skilled therapeutic intervention in order to improve the following deficits and impairments:  Postural dysfunction, Improper body mechanics, Pain, Increased fascial restricitons, Increased muscle spasms, Hypomobility, Decreased range of motion, Decreased mobility, Decreased activity tolerance  Visit Diagnosis: Cervicalgia  Pain in right arm  Other symptoms and signs involving the musculoskeletal system  Abnormal posture     Problem List Patient Active Problem List   Diagnosis Date Noted  . Statin declined 08/16/2018  . Type 2 diabetes mellitus with hyperglycemia (HCC) 05/18/2018  . Mild peripheral edema 05/12/2018  . Fatigue 05/12/2018  . Mixed hyperlipidemia 06/22/2017  . Corn of foot 05/12/2017  . Class 1 obesity due to excess calories  with serious comorbidity and body mass index (BMI) of 32.0 to 32.9 in adult 05/08/2017  . Wears contact lenses 05/08/2017  . Hypertension associated with diabetes (HCC) 12/21/2016  . History of CVA (cerebrovascular accident) 12/16/2016  . Hydrocephalus with operating shunt (HCC) 12/16/2016     P  Leonor Liv PT, MPH  11/22/2018, 4:51 PM  [facility] [address] [place], [place], [address] Phone: [phone]   Fax:  [phone]  Name: Prather Failla MRN: [id number] Date of Birth: 1961/03/16

## 2018-11-22 NOTE — Patient Instructions (Addendum)
    Bow and arrow with theraband  10-20 reps each side    Lying with swim noodle along spine arms out to side w/ hips and knees bent feet resting on either side of noodle 3-5 min   Then lying on back with noodle across your body arms out to side as above   Knee Roll    Lying on back, with knees bent and feet flat on floor, arms outstretched to sides, slowly roll both knees to side, hold 30-60 seconds. Back to starting position, hold 5-10  seconds. Then to opposite side, hold 30-60 seconds. Return to starting position. Keep shoulders and arms in contact with floor and core tight  .Access Code: [email or web address]  URL: [email or web address]  Date: 11/22/2018  Prepared by: Corlis Leak   Exercises  Seated Cervical Retraction - 10 reps - 1 sets - 3x daily - 7x weekly  Standing Scapular Retraction - 10 reps - 1 sets - 10 hold - 3x daily - 7x weekly  Shoulder External Rotation and Scapular Retraction - 10 reps - 1 sets - hold - 3x daily - 7x weekly  Standing Scapular Retraction in Abduction - 10 reps - 1 sets - 3x daily - 7x weekly  Doorway Pec Stretch at 60 Degrees Abduction - 3 reps - 1 sets - 3x daily - 7x weekly  Doorway Pec Stretch at 90 Degrees Abduction - 3 reps - 1 sets - 30 seconds hold - 3x daily - 7x weekly  Doorway Pec Stretch at 120 Degrees Abduction - 3 reps - 1 sets - 30 second hold hold - 3x daily - 7x weekly   Today  Shoulder External Rotation and Scapular Retraction with Resistance - 10 reps - 3 sets - 1x daily - 7x weekly  Scapular Retraction with Resistance - 10 reps - 3 sets - 1x daily - 7x weekly  Scapular Retraction with Resistance Advanced - 10 reps - 3 sets - 2x daily - 7x weekly

## 2018-11-25 ENCOUNTER — Ambulatory Visit (INDEPENDENT_AMBULATORY_CARE_PROVIDER_SITE_OTHER): Payer: BC Managed Care – PPO | Admitting: Physical Therapy

## 2018-11-25 ENCOUNTER — Other Ambulatory Visit: Payer: Self-pay

## 2018-11-25 DIAGNOSIS — M79601 Pain in right arm: Secondary | ICD-10-CM

## 2018-11-25 DIAGNOSIS — R293 Abnormal posture: Secondary | ICD-10-CM

## 2018-11-25 DIAGNOSIS — R29898 Other symptoms and signs involving the musculoskeletal system: Secondary | ICD-10-CM

## 2018-11-25 DIAGNOSIS — M542 Cervicalgia: Secondary | ICD-10-CM | POA: Diagnosis not present

## 2018-11-25 NOTE — Therapy (Addendum)
[place] [facility] [place], [place], [address] Phone: [phone]   Fax:  [phone]  Physical Therapy Treatment  Patient Details  Name: James Fritz MRN: [id number] Date of Birth: 1961/04/10 Referring Provider (PT): Dr Georgina Snell   Encounter Date: 11/25/2018  PT End of Session - 11/25/18 1604    Visit Number  4    Number of Visits  12    Date for PT Re-Evaluation  12/28/18    Authorization - Visit Number  --    PT Start Time  7782    PT Stop Time  1642    PT Time Calculation (min)  39 min    Activity Tolerance  Patient tolerated treatment well;No increased pain    Behavior During Therapy  WFL for tasks assessed/performed       Past Medical History:  Diagnosis Date  . Colon polyps   . CVA (cerebral vascular accident) ([place])   . Hydrocephalus ([place])   . Hypertension   . Mixed hyperlipidemia 06/22/2017   10-yr ASCVD risk 7%  . SAH (subarachnoid hemorrhage) ([place]) 2010    Past Surgical History:  Procedure Laterality Date  . COLONOSCOPY W/ POLYPECTOMY    . CSF SHUNT    . VASECTOMY      There were no vitals filed for this visit.  Subjective Assessment - 11/25/18 1610    Subjective  Pt reports he is feeling great.  Has been pain free for 7 days. He has already done some band work today, and plans to take a step class tonight    Patient Stated Goals  get rid of the pain     Currently in Pain?  No/denies         [facility] PT Assessment - 11/25/18 0001      Assessment   Medical Diagnosis  Cervical dysfunction; Rt UE pain     Referring Provider (PT)  Dr Georgina Snell    Onset Date/Surgical Date  11/06/18    Hand Dominance  Right    Next MD Visit  PRN    Prior Therapy  for CVA; gamekeepers thumb       Observation/Other Assessments   Focus on Therapeutic Outcomes (FOTO)   28% limited (30% goal)      AROM   Cervical Flexion  60    Cervical Extension  50    Cervical - Right Side Bend  40    Cervical - Left Side  Bend  32    Cervical - Right Rotation  67    Cervical - Left Rotation  67                   [facility] Adult PT Treatment/Exercise - 11/25/18 0001      Neck Exercises: Machines for Strengthening   UBE (Upper Arm Bike)  L2 x 4 min alt fwd/back       Neck Exercises: Theraband   Other Theraband Exercises  verbally reviewed current HEP - pt demonstrated 1 rep of each for technique check.       Neck Exercises: Seated   Lateral Flexion  Right;Left;5 reps    Other Seated Exercise  row at 45deg with blue band x 10 reps       Neck Exercises: Prone   W Back  10 reps   with axial ext   Other Prone Exercise  opp arm/ leg x 5 reps       Shoulder Exercises: Stretch   Cross Chest Stretch  2 reps;20 seconds   with levator stretch    Other Shoulder Stretches  3 way doorway 20 sec x 2 each position,  bilat bicep stretch holding door x 20 sec x 2 reps     Other Shoulder Stretches  horiz abdct holding door with elbow straight and turning truck away from arm: 20 sec, 2 reps each arm.       Modalities   Modalities  --   pt declined     Neck Exercises: Stretches   Other Neck Stretches  cervical flex to neutral x 5 reps; then cervical diagonals to stretch levator x 3 each side.              PT Education - 11/25/18 1714    Education Details  HEP - updated     Person(s) Educated  Patient    Methods  Explanation;Verbal cues;Demonstration;Tactile cues   pt declined new handout.    Comprehension  Verbalized understanding;Returned demonstration          PT Long Term Goals - 11/25/18 1611      PT LONG TERM GOAL #1   Title  Improve posture and alignment with patient to demonstrate improved upright posture with posterior shoulder girdle musculature engaged 12/28/2018    Time  6    Period  Weeks    Status  Achieved      PT LONG TERM GOAL #2   Title  Decrease pain and radicular symptoms into Rt UE by 50-75% allowing patient to use UE's for functional activities 12/28/2018    Time  6     Period  Weeks    Status  Achieved      PT LONG TERM GOAL #3   Title  Increase cervical extension and lateral flexion by 5-8 degrees 12/28/2018    Time  6    Period  Weeks    Status  Achieved      PT LONG TERM GOAL #4   Title  Independent in HEp 12/28/2018    Time  6    Period  Weeks    Status  On-going      PT LONG TERM GOAL #5   Title  Improve FOTO to </= 30% limitation 12/28/2018    Time  6    Period  Weeks    Status  Achieved            Plan - 11/25/18 1708    Clinical Impression Statement  Pt demonstrated improved neck ROM. He tolerated all exercises well without any production of pain or fatigue.  Pt has partially met his goals and requests to hold therapy while he works on his HEP.      Rehab Potential  Good    PT Frequency  2x / week    PT Duration  6 weeks    PT Treatment/Interventions  Patient/family education;ADLs/Self Care Home Management;Cryotherapy;Electrical Stimulation;Iontophoresis 4mg/ml Dexamethasone;Moist Heat;Ultrasound;Therapeutic activities;Therapeutic exercise;Neuromuscular re-education;Manual techniques;Dry needling;Taping    PT Next Visit Plan  will hold therapy for 30 days per pt request; if pt doesn't return by 12/25/18, will d/c.     PT Home Exercise Plan  Access Code: [email or web address]     Consulted and Agree with Plan of Care  Patient       Patient will benefit from skilled therapeutic intervention in order to improve the following deficits and impairments:  Postural dysfunction, Improper body mechanics, Pain, Increased fascial restricitons, Increased muscle spasms, Hypomobility, Decreased range of motion, Decreased mobility, Decreased activity tolerance    Visit Diagnosis: Cervicalgia  Pain in right arm  Other symptoms and signs involving the musculoskeletal system  Abnormal posture     Problem List Patient Active Problem List   Diagnosis Date Noted  . Statin declined 08/16/2018  . Type 2 diabetes mellitus with hyperglycemia ([place]) 05/18/2018   . Mild peripheral edema 05/12/2018  . Fatigue 05/12/2018  . Mixed hyperlipidemia 06/22/2017  . Corn of foot 05/12/2017  . Class 1 obesity due to excess calories with serious comorbidity and body mass index (BMI) of 32.0 to 32.9 in adult 05/08/2017  . Wears contact lenses 05/08/2017  . Hypertension associated with diabetes ([place]) 12/21/2016  . History of CVA (cerebrovascular accident) 12/16/2016  . Hydrocephalus with operating shunt [facility]) 12/16/2016   Kerin Perna, PTA 11/25/18 5:30 PM  [place], [place], [address] Phone: [phone]   Fax:  [phone]  Name: James Fritz MRN: [id number] Date of Birth: [birth year]  PHYSICAL THERAPY DISCHARGE SUMMARY  Visits from Start of Care: 4  Current functional level related to goals / functional outcomes: See progress note for discharge status    Remaining deficits: No known deficits    Education / Equipment: HEP; postural correction  Plan: Patient agrees to discharge.  Patient goals were met. Patient is being discharged due to meeting the stated rehab goals.  ?????     Celyn P. Helene Kelp PT, MPH 12/08/18 2:52 PM

## 2018-12-02 ENCOUNTER — Encounter: Payer: BLUE CROSS/BLUE SHIELD | Admitting: Physical Therapy

## 2018-12-30 ENCOUNTER — Encounter: Payer: Self-pay | Admitting: Physician Assistant

## 2018-12-31 ENCOUNTER — Ambulatory Visit (INDEPENDENT_AMBULATORY_CARE_PROVIDER_SITE_OTHER): Payer: BC Managed Care – PPO | Admitting: Family Medicine

## 2018-12-31 ENCOUNTER — Ambulatory Visit (INDEPENDENT_AMBULATORY_CARE_PROVIDER_SITE_OTHER): Payer: BC Managed Care – PPO

## 2018-12-31 ENCOUNTER — Other Ambulatory Visit: Payer: Self-pay

## 2018-12-31 ENCOUNTER — Encounter: Payer: Self-pay | Admitting: Family Medicine

## 2018-12-31 VITALS — BP 136/96 | HR 72 | Temp 98.3°F | Ht 69.0 in | Wt 223.0 lb

## 2018-12-31 DIAGNOSIS — M19012 Primary osteoarthritis, left shoulder: Secondary | ICD-10-CM | POA: Diagnosis not present

## 2018-12-31 DIAGNOSIS — M25512 Pain in left shoulder: Secondary | ICD-10-CM

## 2018-12-31 NOTE — Patient Instructions (Addendum)
Thank you for coming in today.  Work on your exercises.  Do about 30 reps 2-3x daily.  If not improving let me know and next step is PT.  If not improving with PT will likely proceed to MRI or injection.   Up to the front  Up to the side Internal rotation  external rotation   Secondary Shoulder Impingement Syndrome Rehab Ask your health care provider which exercises are safe for you. Do exercises exactly as told by your health care provider and adjust them as directed. It is normal to feel mild stretching, pulling, tightness, or discomfort as you do these exercises. Stop right away if you feel sudden pain or your pain gets worse. Do not begin these exercises until told by your health care provider. Stretching and range-of-motion exercise This exercise warms up your muscles and joints and improves the movement and flexibility of your neck and shoulder. This exercise also helps to relieve pain and stiffness. Cervical side bend  1. Using good posture, sit on a stable chair, or stand up. 2. Without moving your shoulders, slowly tilt your left / right ear toward your left / right shoulder until you feel a stretch in your neck (cervical) muscles on the other side. You should be looking straight ahead. 3. Hold for __________ seconds. 4. Slowly return to the starting position. 5. Repeat the stretch on your left / right side. Repeat __________ times. Complete this exercise __________ times a day. Strengthening exercises These exercises build strength and endurance in your shoulder. Endurance is the ability to use your muscles for a long time, even after they get tired. Scapular protraction, supine  1. Lie on your back on a firm surface (supine position). Hold a __________ weight in your left / right hand. 2. Raise your left / right arm straight into the air so your hand is directly above your shoulder joint. 3. Push the weight into the air so your shoulder (scapula) lifts off the surface that  you are lying on. The scapula will push up or forward (protraction). Do not move your head, neck, or back. 4. Hold for __________ seconds. 5. Slowly return to the starting position. Let your muscles relax completely before you repeat this exercise. Repeat __________ times. Complete this exercise __________ times a day. Scapular retraction  1. Sit in a stable chair without armrests, or stand up. 2. Secure an exercise band to a stable object in front of you so the band is at shoulder height. 3. Hold one end of the exercise band in each hand. Your palms should face down. 4. Squeeze your shoulder blades (scapulae) together and move your elbows slightly behind you (retraction). Do not shrug your shoulders upward while you do this. 5. Hold for __________ seconds. 6. Slowly return to the starting position. Repeat __________ times. Complete this exercise __________ times a day. Shoulder extension with scapular retraction  1. Sit in a stable chair without armrests, or stand up. 2. Secure an exercise band to a stable object in front of you so the band is above shoulder height. 3. Hold one end of the exercise band in each hand. 4. Straighten your elbows and lift your hands up to shoulder height. 5. Squeeze your shoulder blades together (scapular retraction) and pull your hands down to the sides of your thighs (shoulder extension). Stop when your hands are straight down by your sides. Do not let your hands go behind your body. 6. Hold for __________ seconds. 7. Slowly return to the  starting position. Repeat __________ times. Complete this exercise __________ times a day. Shoulder abduction 1. Sit in a stable chair without armrests, or stand up. 2. If directed, hold a __________ weight in your left / right hand. 3. Start with your arms straight down. Turn your left / right hand so your palm faces in, toward your body. 4. Slowly lift your left / right hand out to your side (abduction). Do not lift your  hand above shoulder height. ? Keep your arms straight. ? Avoid shrugging your shoulder while you do this movement. Keep your shoulder blade tucked down toward the middle of your back. 5. Hold for __________ seconds. 6. Slowly lower your arm, and return to the starting position. Repeat __________ times. Complete this exercise __________ times a day. This information is not intended to replace advice given to you by your health care provider. Make sure you discuss any questions you have with your health care provider. Document Released: 06/09/2005 Document Revised: 10/01/2018 Document Reviewed: 07/15/2018 Elsevier Patient Education  2020 [employer]
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
## 2019-02-28 ENCOUNTER — Other Ambulatory Visit: Payer: Self-pay | Admitting: Physician Assistant

## 2019-05-31 ENCOUNTER — Encounter: Payer: Self-pay | Admitting: Family Medicine

## 2019-05-31 MED ORDER — METFORMIN HCL ER 500 MG PO TB24: 1000.0000 mg | ORAL_TABLET | Freq: Every day | ORAL | 0 refills | Status: DC

## 2019-06-10 ENCOUNTER — Other Ambulatory Visit: Payer: Self-pay

## 2019-06-10 ENCOUNTER — Encounter: Payer: Self-pay | Admitting: Osteopathic Medicine

## 2019-06-10 ENCOUNTER — Ambulatory Visit (INDEPENDENT_AMBULATORY_CARE_PROVIDER_SITE_OTHER): Payer: BC Managed Care – PPO | Admitting: Osteopathic Medicine

## 2019-06-10 VITALS — BP 148/101 | HR 72 | Temp 98.0°F | Wt 221.1 lb

## 2019-06-10 DIAGNOSIS — Z0000 Encounter for general adult medical examination without abnormal findings: Secondary | ICD-10-CM | POA: Diagnosis not present

## 2019-06-10 DIAGNOSIS — E1165 Type 2 diabetes mellitus with hyperglycemia: Secondary | ICD-10-CM | POA: Diagnosis not present

## 2019-06-10 DIAGNOSIS — E782 Mixed hyperlipidemia: Secondary | ICD-10-CM | POA: Diagnosis not present

## 2019-06-10 DIAGNOSIS — I10 Essential (primary) hypertension: Secondary | ICD-10-CM

## 2019-06-10 DIAGNOSIS — Z8673 Personal history of transient ischemic attack (TIA), and cerebral infarction without residual deficits: Secondary | ICD-10-CM

## 2019-06-10 DIAGNOSIS — E1159 Type 2 diabetes mellitus with other circulatory complications: Secondary | ICD-10-CM | POA: Diagnosis not present

## 2019-06-10 LAB — POCT GLYCOSYLATED HEMOGLOBIN (HGB A1C): Hemoglobin A1C: 7.2 % — AB (ref 4.0–5.6)

## 2019-06-10 MED ORDER — METFORMIN HCL ER 500 MG PO TB24: 1000.0000 mg | ORAL_TABLET | Freq: Every day | ORAL | 1 refills | Status: DC

## 2019-06-10 MED ORDER — LOSARTAN POTASSIUM 50 MG PO TABS: 50.0000 mg | ORAL_TABLET | Freq: Every day | ORAL | 1 refills | Status: DC

## 2019-06-10 MED ORDER — ATORVASTATIN CALCIUM 40 MG PO TABS: 40.0000 mg | ORAL_TABLET | Freq: Every day | ORAL | 3 refills | Status: DC

## 2019-06-10 NOTE — Progress Notes (Signed)
HPI: James Fritz is a 58 y.o. male who  has a past medical history of Colon polyps, CVA (cerebral vascular accident) ([place]), Hydrocephalus ([place]), Hypertension, Mixed hyperlipidemia (06/22/2017), and SAH (subarachnoid hemorrhage) ([place]) (2010).  he presents to [facility] today, 06/10/19,  for chief complaint of: Annual physical, follow-up chronic medical conditions, establish new PCP  Diabetes: A1c as below, not quite to goal but stable from previous checks.  Patient is taking Metformin 1000 mg daily  History of hemorrhagic stroke, unknown if due to aneurysm, but presumably so.  Some stuttering deficits from this, some balance deficits from this.  Patient has previously declined statins but is open to starting one now for prevention of further cardiovascular complications.    Past medical, surgical, social and family history reviewed:  Patient Active Problem List   Diagnosis Date Noted  . Statin declined 08/16/2018  . Type 2 diabetes mellitus with hyperglycemia ([place]) 05/18/2018  . Mixed hyperlipidemia 06/22/2017  . Class 1 obesity due to excess calories with serious comorbidity and body mass index (BMI) of 32.0 to 32.9 in adult 05/08/2017  . Hypertension associated with diabetes ([place]) 12/21/2016  . History of CVA (cerebrovascular accident) 12/16/2016  . Hydrocephalus with operating shunt ([place]) 12/16/2016    Past Surgical History:  Procedure Laterality Date  . COLONOSCOPY W/ POLYPECTOMY    . CSF SHUNT    . VASECTOMY      Social History   Tobacco Use  . Smoking status: Former Smoker    Quit date: 06/23/1992    Years since quitting: 26.9  . Smokeless tobacco: Never Used  Substance Use Topics  . Alcohol use: Yes    Alcohol/week: 3.0 - 5.0 standard drinks    Types: 3 - 5 Standard drinks or equivalent per week    Family History  Problem Relation Age of Onset  . Diabetes Mother   . Diabetes Father   . Hypertension Father   .  Schizophrenia Sister   . Hypertension Brother   . Heart disease Brother   . Prostate cancer Neg Hx      Current medication list and allergy/intolerance information reviewed:    Current Outpatient Medications  Medication Sig Dispense Refill  . Lancets (ONETOUCH ULTRASOFT) lancets Use as instructed 100 each 12  . losartan (COZAAR) 50 MG tablet Take 1 tablet (50 mg total) by mouth daily. 90 tablet 1  . metFORMIN (GLUCOPHAGE-XR) 500 MG 24 hr tablet Take 2 tablets (1,000 mg total) by mouth daily with breakfast. 180 tablet 1  . atorvastatin (LIPITOR) 40 MG tablet Take 1 tablet (40 mg total) by mouth daily. 90 tablet 3   No current facility-administered medications for this visit.    No Known Allergies    Review of Systems:  Constitutional:  No  fever, no chills  HEENT: No  headache  Cardiac: No  chest pain  Respiratory:  No  shortness of breath. No  Cough  Gastrointestinal: No  abdominal pain  Musculoskeletal: No new myalgia/arthralgia  Neurologic: No  weakness, No  dizziness  Psychiatric: No  concerns with depression, No  concerns with anxiety, No sleep problems, No mood problems  Exam:  BP (!) 148/101 (BP Location: Right Arm, Patient Position: Sitting, Cuff Size: Normal)   Pulse 72   Temp 98 F (36.7 C) (Oral)   Wt 221 lb 1.3 oz (100.3 kg)   BMI 32.65 kg/m   Constitutional: VS see above. General Appearance: alert, well-developed, well-nourished, NAD  Neck: No masses, trachea  midline. No thyroid enlargement. No tenderness/mass appreciated. No lymphadenopathy  Respiratory: Normal respiratory effort. no wheeze, no rhonchi, no rales  Cardiovascular: S1/S2 normal, no murmur, no rub/gallop auscultated. RRR. No lower extremity edema.  Musculoskeletal: Gait normal. No clubbing/cyanosis of digits.   Neurological: Normal balance/coordination. No tremor.   Skin: warm, dry, intact. No rash/ulcer.   Psychiatric: Normal judgment/insight. Normal mood and affect. Oriented  x3.    Results for orders placed or performed in visit on 06/10/19 (from the past 72 hour(s))  POCT HgB A1C     Status: Abnormal   Collection Time: 06/10/19 10:04 AM  Result Value Ref Range   Hemoglobin A1C 7.2 (A) 4.0 - 5.6 %   HbA1c POC (<> result, manual entry)     HbA1c, POC (prediabetic range)     HbA1c, POC (controlled diabetic range)      No results found.       ASSESSMENT/PLAN: The primary encounter diagnosis was Annual physical exam. Diagnoses of Type 2 diabetes mellitus with hyperglycemia, without long-term current use of insulin (HCC), Hypertension associated with diabetes (HCC), History of CVA (cerebrovascular accident), and Mixed hyperlipidemia were also pertinent to this visit.   Due for shingles shot, patient thinks he might of had this elsewhere Due for pneumonia shot, patient states he will get this next time   Discussed blood pressure goals particularly given history of hemorrhagic stroke.  Patient states he is only taking 25 mg daily, though med list states he should be taking this at 50 mg / 2 tablets.  Will increase to 50 mg, patient okay to double up on what he has.  Statin initiated.   Orders Placed This Encounter  Procedures  . CBC  . COMPLETE METABOLIC PANEL WITH GFR  . LIPID SCREENING  . PSA SOLSTAS  . POCT HgB A1C    Meds ordered this encounter  Medications  . losartan (COZAAR) 50 MG tablet    Sig: Take 1 tablet (50 mg total) by mouth daily.    Dispense:  90 tablet    Refill:  1  . metFORMIN (GLUCOPHAGE-XR) 500 MG 24 hr tablet    Sig: Take 2 tablets (1,000 mg total) by mouth daily with breakfast.    Dispense:  180 tablet    Refill:  1  . atorvastatin (LIPITOR) 40 MG tablet    Sig: Take 1 tablet (40 mg total) by mouth daily.    Dispense:  90 tablet    Refill:  3    Health Maintenance  Topic Date Due  . PNEUMOCOCCAL POLYSACCHARIDE VACCINE AGE 10-64 HIGH RISK  07/01/1962  . FOOT EXAM  05/25/2019  . OPHTHALMOLOGY EXAM  08/23/2019  .  HEMOGLOBIN A1C  12/09/2019  . COLONOSCOPY  05/21/2022  . TETANUS/TDAP  09/04/2027  . INFLUENZA VACCINE  Completed  . Hepatitis C Screening  Completed          Visit summary with medication list and pertinent instructions was printed for patient to review. All questions at time of visit were answered - patient instructed to contact office with any additional concerns or updates. ER/RTC precautions were reviewed with the patient.   Please note: voice recognition software was used to produce this document, and typos may escape review. Please contact Dr. Lyn Hollingshead for any needed clarifications.     Follow-up plan: Return in about 4 months (around 10/09/2019) for recheck A1C and labs, see me sooner if needed.

## 2019-06-23 ENCOUNTER — Other Ambulatory Visit: Payer: Self-pay | Admitting: Osteopathic Medicine

## 2019-06-23 NOTE — Telephone Encounter (Signed)
Requested medication (s) are due for refill today: yes  Requested medication (s) are on the active medication list: yes  Last refill:  05/31/2019  Future visit scheduled:yes  Notes to clinic:  review for refill   Requested Prescriptions  Pending Prescriptions Disp Refills   metFORMIN (GLUCOPHAGE-XR) 500 MG 24 hr tablet [Pharmacy Med Name: METFORMIN HCL ER 500 MG TABLET] 60 tablet 0    Sig: TAKE 2 TABLETS (1,000 MG TOTAL) BY MOUTH DAILY WITH BREAKFAST. DUE FOR FOLLOW UP VISIT W/PCP      Endocrinology:  Diabetes - Biguanides Failed - 06/23/2019  2:30 PM      Failed - Cr in normal range and within 360 days    Creat  Date Value Ref Range Status  05/12/2018 0.76 0.70 - 1.33 mg/dL Final    Comment:    For patients >108 years of age, the reference limit for Creatinine is approximately 13% higher for people identified as African-American. .           Failed - eGFR in normal range and within 360 days    GFR, Est African American  Date Value Ref Range Status  05/12/2018 119 > OR = 60 mL/min/1.26m Final   GFR, Est Non African American  Date Value Ref Range Status  05/12/2018 103 > OR = 60 mL/min/1.764mFinal          Passed - HBA1C is between 0 and 7.9 and within 180 days    Hemoglobin A1C  Date Value Ref Range Status  06/10/2019 7.2 (A) 4.0 - 5.6 % Final   HbA1c, POC (controlled diabetic range)  Date Value Ref Range Status  08/16/2018 7.2 (A) 0.0 - 7.0 % Final          Passed - Valid encounter within last 6 months    Recent Outpatient Visits           1 week ago Annual physical exam   [facility]   5 months ago Acute pain of left shoulder   [facility]   7 months ago Cervical paraspinal muscle spasm   [facility]   10 months ago Type 2 diabetes mellitus with hyperglycemia, without long-term current  use of insulin ([facility]   1 year ago [facility] diagnosed [facility]       Future Appointments             [facility]

## 2019-07-15 ENCOUNTER — Telehealth: Payer: Self-pay

## 2019-07-15 NOTE — Telephone Encounter (Signed)
[facility] m/o pharmacy requesting med refills for one touch verio strips 100's. Rx not listed in active med list.

## 2019-07-19 MED ORDER — GLUCOSE BLOOD VI STRP: ORAL_STRIP | 99 refills | Status: AC

## 2019-08-12 IMAGING — DX LEFT SHOULDER - 2+ VIEW
3 series · 3 of 3 positions shown · non-contrast
Comparison: None.

CLINICAL DATA: Left shoulder pain for the past 3 months.

EXAM:
LEFT SHOULDER - 2+ VIEW

[shoulder grashey]
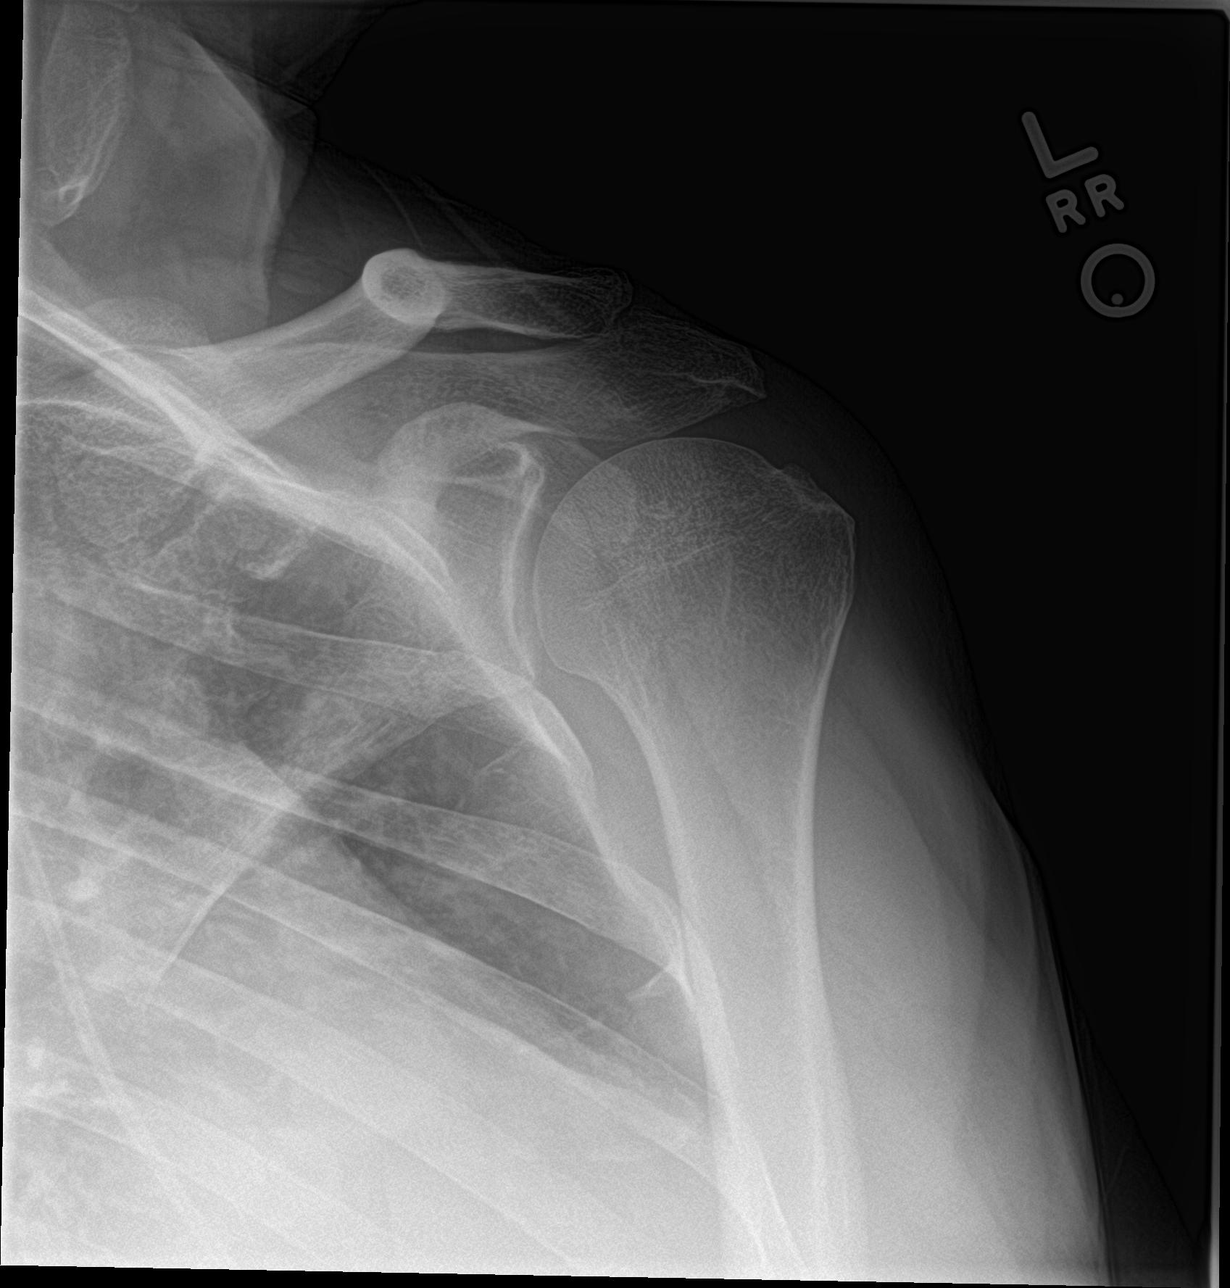

[shoulder y view]
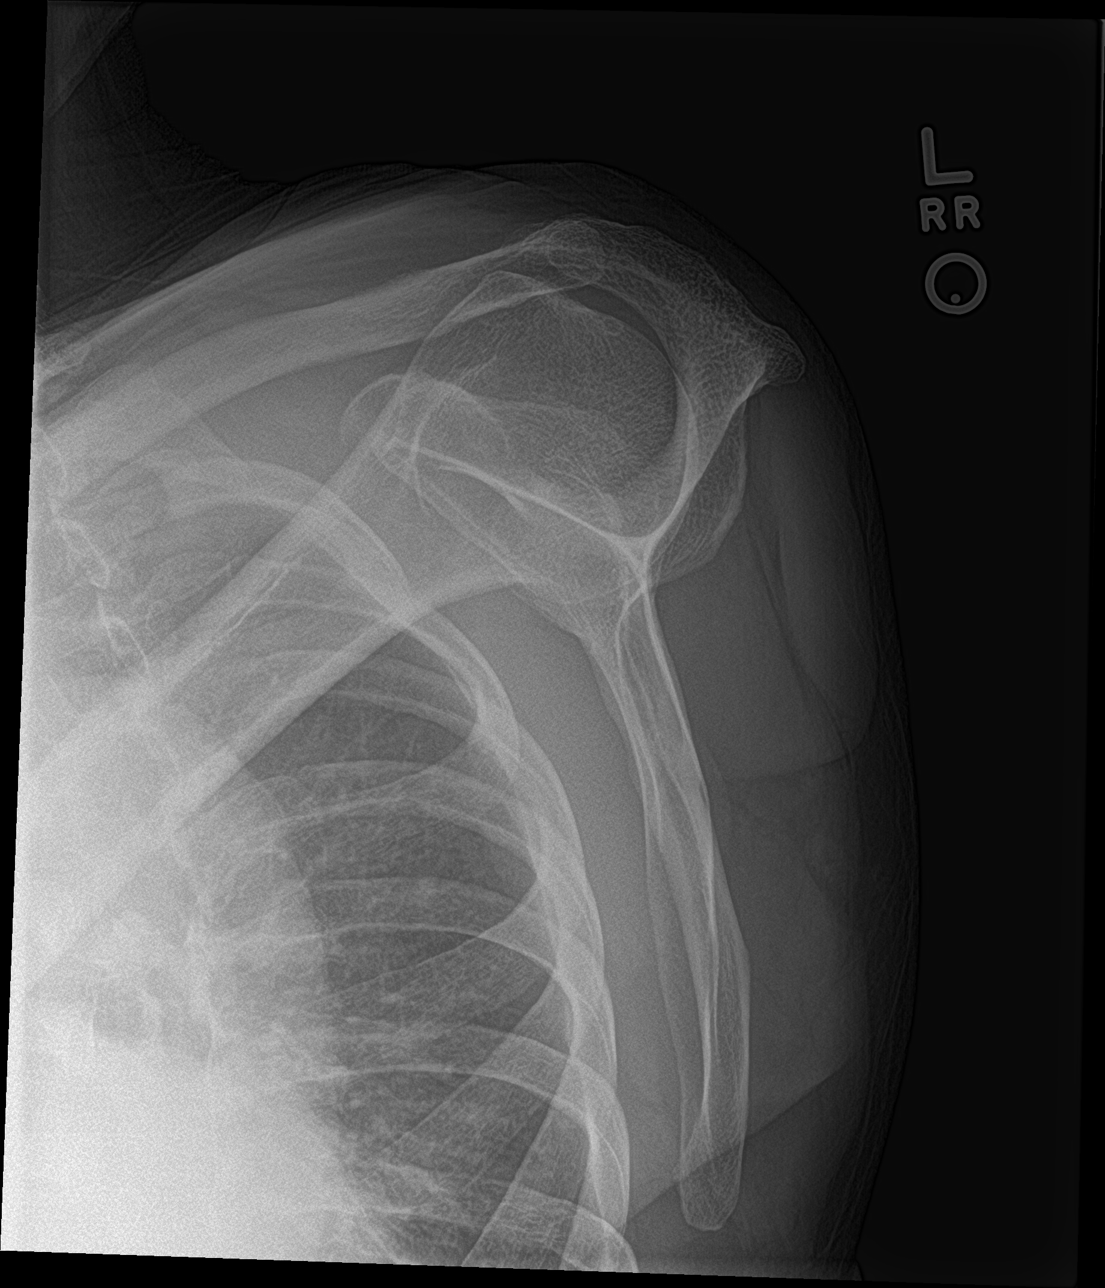

[shoulder axillary]
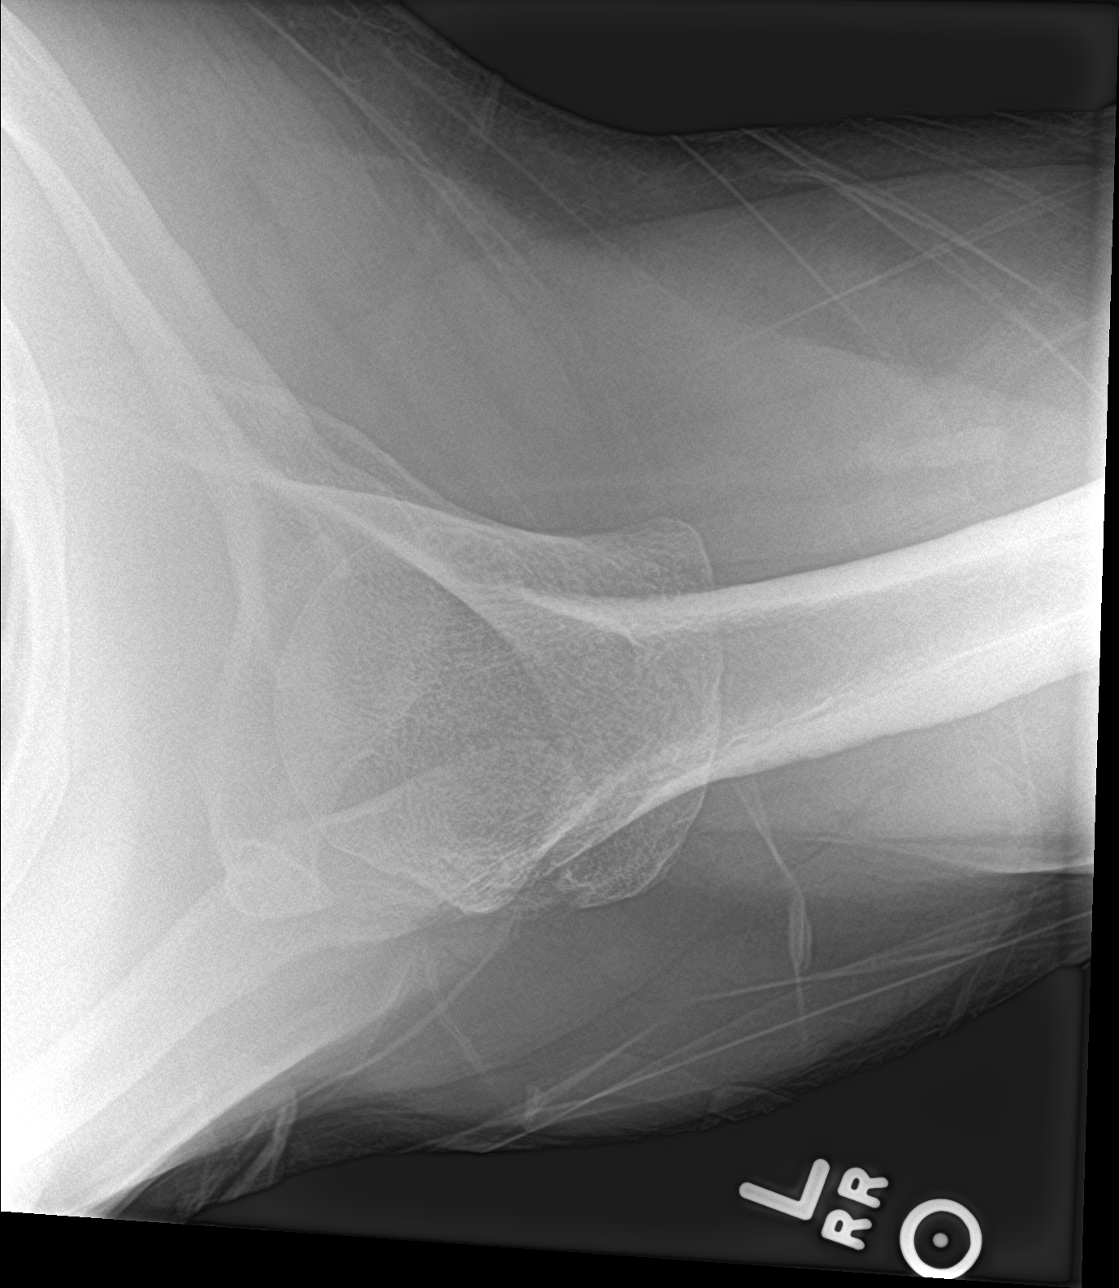

[3 of 3 positions shown; findings below may reference images not displayed]

FINDINGS: No acute fracture or dislocation. Mild acromioclavicular joint space
narrowing with small marginal osteophytes. The glenohumeral joint
space is preserved. Bone mineralization is normal. Soft tissues are
unremarkable.
IMPRESSION: 1. Mild acromioclavicular osteoarthritis.

## 2019-08-23 ENCOUNTER — Encounter: Payer: Self-pay | Admitting: Osteopathic Medicine

## 2019-08-29 ENCOUNTER — Other Ambulatory Visit: Payer: Self-pay | Admitting: Osteopathic Medicine

## 2019-10-10 ENCOUNTER — Ambulatory Visit: Payer: BC Managed Care – PPO | Admitting: Osteopathic Medicine

## 2019-10-27 DIAGNOSIS — G910 Communicating hydrocephalus: Secondary | ICD-10-CM | POA: Diagnosis not present

## 2019-12-01 ENCOUNTER — Other Ambulatory Visit: Payer: Self-pay | Admitting: Family Medicine

## 2019-12-02 ENCOUNTER — Other Ambulatory Visit: Payer: Self-pay | Admitting: Osteopathic Medicine

## 2019-12-16 ENCOUNTER — Ambulatory Visit: Payer: BC Managed Care – PPO | Admitting: Medical-Surgical

## 2019-12-21 ENCOUNTER — Ambulatory Visit: Payer: BC Managed Care – PPO | Admitting: Family Medicine

## 2019-12-22 ENCOUNTER — Encounter: Payer: Self-pay | Admitting: Family Medicine

## 2019-12-22 ENCOUNTER — Other Ambulatory Visit: Payer: Self-pay

## 2019-12-22 ENCOUNTER — Ambulatory Visit (INDEPENDENT_AMBULATORY_CARE_PROVIDER_SITE_OTHER): Payer: BC Managed Care – PPO | Admitting: Family Medicine

## 2019-12-22 VITALS — BP 131/93 | HR 101 | Ht 68.9 in | Wt 215.7 lb

## 2019-12-22 DIAGNOSIS — I152 Hypertension secondary to endocrine disorders: Secondary | ICD-10-CM

## 2019-12-22 DIAGNOSIS — E1159 Type 2 diabetes mellitus with other circulatory complications: Secondary | ICD-10-CM | POA: Diagnosis not present

## 2019-12-22 DIAGNOSIS — H60542 Acute eczematoid otitis externa, left ear: Secondary | ICD-10-CM

## 2019-12-22 DIAGNOSIS — I10 Essential (primary) hypertension: Secondary | ICD-10-CM

## 2019-12-22 DIAGNOSIS — E1165 Type 2 diabetes mellitus with hyperglycemia: Secondary | ICD-10-CM

## 2019-12-22 DIAGNOSIS — I8391 Asymptomatic varicose veins of right lower extremity: Secondary | ICD-10-CM

## 2019-12-22 DIAGNOSIS — R5383 Other fatigue: Secondary | ICD-10-CM | POA: Diagnosis not present

## 2019-12-22 DIAGNOSIS — H6090 Unspecified otitis externa, unspecified ear: Secondary | ICD-10-CM | POA: Insufficient documentation

## 2019-12-22 DIAGNOSIS — E782 Mixed hyperlipidemia: Secondary | ICD-10-CM

## 2019-12-22 MED ORDER — NEOMYCIN-POLYMYXIN-HC 3.5-10000-1 OT SOLN
3.0000 [drp] | Freq: Four times a day (QID) | OTIC | 0 refills | Status: DC
Start: 2019-12-22 — End: 2020-11-16

## 2019-12-22 NOTE — Assessment & Plan Note (Addendum)
Update lipid panel.  Reports 2 brothers who died from heart disease in their Mid 62's Discussed he is at high risk and would benefit from statin with history of diabetes.  He will wait until lipid results return.

## 2019-12-22 NOTE — Assessment & Plan Note (Signed)
Start cortisporin drips.

## 2019-12-22 NOTE — Patient Instructions (Addendum)
Nice to meet you today! Have labs completed, early morning and fasting (you can take medications with water or black coffee) Try ear drops, let me know if not improving.

## 2019-12-22 NOTE — Progress Notes (Signed)
James Fritz - 59 y.o. male MRN [id number]  Date of birth: 1960/11/30  Subjective Chief Complaint  Patient presents with  . Varicose Veins  . Otalgia  . Fatigue    HPI [name] is a 59 y.o. male with history of HTN, T2DM, and HLD here today to discuss a few issues.   -Ear pain:  Has had episodes of bilateral ear pain with swelling in the canal bilaterally.  He denies drainage from the ear.  He has had some mild itching.   -Fatigue:  Reports increased fatigue over the past few months.  This is worse after he eats dinner.  He has history of diabetes. Compliant with metformin but hasn't check blood sugars recently.  He is also concerned about other causes such as low testosterone levels.    -Varicose veins:  Has multiple small spider veins and a couple of varicosities along RLE.  Marland Kitchen  He is curious if this will cause any any issues or if it is more cosmetic in nature.  He denies any pain or swelling associated with this.   ROS:  A comprehensive ROS was completed and negative except as noted per HPI  No Known Allergies  Past Medical History:  Diagnosis Date  . Colon polyps   . CVA (cerebral vascular accident) (HCC)   . Hydrocephalus (HCC)   . Hypertension   . Mixed hyperlipidemia 06/22/2017   10-yr ASCVD risk 7%  . SAH (subarachnoid hemorrhage) (HCC) 2010    Past Surgical History:  Procedure Laterality Date  . COLONOSCOPY W/ POLYPECTOMY    . CSF SHUNT    . VASECTOMY      Social History   Socioeconomic History  . Marital status: Married    Spouse name: Not on file  . Number of children: Not on file  . Years of education: Not on file  . Highest education level: Not on file  Occupational History  . Not on file  Tobacco Use  . Smoking status: Former Smoker    Quit date: 06/23/1992    Years since quitting: 27.5  . Smokeless tobacco: Never Used  Substance and Sexual Activity  . Alcohol use: Yes    Alcohol/week: 3.0 - 5.0 standard drinks    Types: 3 - 5 Standard  drinks or equivalent per week  . Drug use: No  . Sexual activity: Yes    Partners: Female  Other Topics Concern  . Not on file  Social History Narrative  . Not on file   Social Determinants of Health   Financial Resource Strain:   . Difficulty of Paying Living Expenses:   Food Insecurity:   . Worried About Programme researcher, broadcasting/film/video in the Last Year:   . Barista in the Last Year:   Transportation Needs:   . Freight forwarder (Medical):   Marland Kitchen Lack of Transportation (Non-Medical):   Physical Activity:   . Days of Exercise per Week:   . Minutes of Exercise per Session:   Stress:   . Feeling of Stress :   Social Connections:   . Frequency of Communication with Friends and Family:   . Frequency of Social Gatherings with Friends and Family:   . Attends Religious Services:   . Active Member of Clubs or Organizations:   . Attends Banker Meetings:   Marland Kitchen Marital Status:     Family History  Problem Relation Age of Onset  . Diabetes Mother   . Diabetes Father   .  Hypertension Father   . Schizophrenia Sister   . Hypertension Brother   . Heart disease Brother   . Prostate cancer Neg Hx     Health Maintenance  Topic Date Due  . PNEUMOCOCCAL POLYSACCHARIDE VACCINE AGE 28-64 HIGH RISK  Never done  . COVID-19 Vaccine (1) Never done  . FOOT EXAM  05/25/2019  . OPHTHALMOLOGY EXAM  08/23/2019  . HEMOGLOBIN A1C  12/09/2019  . INFLUENZA VACCINE  01/22/2020  . COLONOSCOPY  05/21/2022  . TETANUS/TDAP  09/04/2027  . Hepatitis C Screening  Completed     ----------------------------------------------------------------------------------------------------------------------------------------------------------------------------------------------------------------- Physical Exam BP (!) 131/93 (BP Location: Left Arm, Patient Position: Sitting, Cuff Size: Normal)   Pulse (!) 101   Ht 5' 8.9" (1.75 m)   Wt 215 lb 11.2 oz (97.8 kg)   SpO2 97%   BMI 31.95 kg/m   Physical  Exam Constitutional:      Appearance: Normal appearance.  HENT:     Head: Normocephalic and atraumatic.     Ears:     Comments: L ear canal with swelling and erythema.  NO drainage noted.  Eyes:     General: No scleral icterus. Cardiovascular:     Rate and Rhythm: Normal rate and regular rhythm.  Pulmonary:     Effort: Pulmonary effort is normal.     Breath sounds: Normal breath sounds.  Musculoskeletal:     Cervical back: Neck supple.  Neurological:     General: No focal deficit present.     Mental Status: He is alert.  Psychiatric:        Mood and Affect: Mood normal.        Behavior: Behavior normal.     ------------------------------------------------------------------------------------------------------------------------------------------------------------------------------------------------------------------- Assessment and Plan  Mixed hyperlipidemia Update lipid panel.  Reports 2 brothers who died from heart disease in their Mid 41's Discussed he is at high risk and would benefit from statin with history of diabetes.  He will wait until lipid results return.   Other fatigue Increased fatigue post prandial.   Update a1c.  Also checking CBC, testosterone and TSH.    Otitis externa Start cortisporin drips.    Varicose veins of right lower extremity Asymptomatic.  Recommend observation and use of compression stockings.    Meds ordered this encounter  Medications  . neomycin-polymyxin-hydrocortisone (CORTISPORIN) OTIC solution    Sig: Place 3 drops into both ears 4 (four) times daily.    Dispense:  10 mL    Refill:  0    No follow-ups on file.    This visit occurred during the SARS-CoV-2 public health emergency.  Safety protocols were in place, including screening questions prior to the visit, additional usage of staff PPE, and extensive cleaning of exam room while observing appropriate contact time as indicated for disinfecting solutions.

## 2019-12-22 NOTE — Assessment & Plan Note (Signed)
Asymptomatic.  Recommend observation and use of compression stockings.

## 2019-12-22 NOTE — Assessment & Plan Note (Signed)
Increased fatigue post prandial.   Update a1c.  Also checking CBC, testosterone and TSH.

## 2019-12-25 ENCOUNTER — Other Ambulatory Visit: Payer: Self-pay | Admitting: Osteopathic Medicine

## 2020-01-09 ENCOUNTER — Other Ambulatory Visit: Payer: Self-pay | Admitting: Physician Assistant

## 2020-01-09 DIAGNOSIS — E1165 Type 2 diabetes mellitus with hyperglycemia: Secondary | ICD-10-CM

## 2020-04-12 ENCOUNTER — Other Ambulatory Visit: Payer: Self-pay | Admitting: Osteopathic Medicine

## 2020-05-07 ENCOUNTER — Other Ambulatory Visit: Payer: Self-pay | Admitting: Osteopathic Medicine

## 2020-06-09 ENCOUNTER — Other Ambulatory Visit: Payer: Self-pay | Admitting: Osteopathic Medicine

## 2020-06-25 ENCOUNTER — Other Ambulatory Visit: Payer: Self-pay | Admitting: Osteopathic Medicine

## 2020-06-25 DIAGNOSIS — E1159 Type 2 diabetes mellitus with other circulatory complications: Secondary | ICD-10-CM

## 2020-06-25 DIAGNOSIS — I152 Hypertension secondary to endocrine disorders: Secondary | ICD-10-CM

## 2020-07-02 ENCOUNTER — Other Ambulatory Visit: Payer: Self-pay

## 2020-07-02 MED ORDER — METFORMIN HCL ER 500 MG PO TB24: ORAL_TABLET | ORAL | 0 refills | Status: DC

## 2020-07-12 ENCOUNTER — Other Ambulatory Visit: Payer: Self-pay | Admitting: Osteopathic Medicine

## 2020-07-31 DIAGNOSIS — D122 Benign neoplasm of ascending colon: Secondary | ICD-10-CM | POA: Diagnosis not present

## 2020-07-31 DIAGNOSIS — Z1211 Encounter for screening for malignant neoplasm of colon: Secondary | ICD-10-CM | POA: Diagnosis not present

## 2020-07-31 DIAGNOSIS — D123 Benign neoplasm of transverse colon: Secondary | ICD-10-CM | POA: Diagnosis not present

## 2020-07-31 DIAGNOSIS — Z86010 Personal history of colonic polyps: Secondary | ICD-10-CM | POA: Diagnosis not present

## 2020-07-31 DIAGNOSIS — K635 Polyp of colon: Secondary | ICD-10-CM | POA: Diagnosis not present

## 2020-07-31 DIAGNOSIS — K5730 Diverticulosis of large intestine without perforation or abscess without bleeding: Secondary | ICD-10-CM | POA: Diagnosis not present

## 2020-07-31 LAB — HM COLONOSCOPY

## 2020-08-10 ENCOUNTER — Emergency Department (INDEPENDENT_AMBULATORY_CARE_PROVIDER_SITE_OTHER)
Admission: EM | Admit: 2020-08-10 | Discharge: 2020-08-10 | Disposition: A | Discharge status: Home / Self Care | Payer: BC Managed Care – PPO | Source: Home / Self Care

## 2020-08-10 ENCOUNTER — Other Ambulatory Visit: Payer: Self-pay

## 2020-08-10 DIAGNOSIS — H60392 Other infective otitis externa, left ear: Secondary | ICD-10-CM | POA: Diagnosis not present

## 2020-08-10 DIAGNOSIS — I152 Hypertension secondary to endocrine disorders: Secondary | ICD-10-CM

## 2020-08-10 DIAGNOSIS — Z760 Encounter for issue of repeat prescription: Secondary | ICD-10-CM

## 2020-08-10 MED ORDER — CEFDINIR 300 MG PO CAPS: 600.0000 mg | ORAL_CAPSULE | Freq: Every day | ORAL | 0 refills | Status: AC

## 2020-08-10 MED ORDER — LOSARTAN POTASSIUM 50 MG PO TABS: 50.0000 mg | ORAL_TABLET | Freq: Every day | ORAL | 0 refills | Status: DC

## 2020-08-10 NOTE — ED Triage Notes (Signed)
Patient presents to Urgent Care with complaints of left ear pain since yesterday. Patient reports he has had a few ear infections recently and thinks it has returned.

## 2020-08-10 NOTE — Discharge Instructions (Signed)
If you actions persist I would recommend you follow-up with ENT.  I am treating you with a stronger course of treatment for this will completely resolve the subsequent infections.

## 2020-08-10 NOTE — ED Provider Notes (Signed)
[facility]    CSN: [id number] Arrival date & time: 08/10/20  1701      History   Chief Complaint Chief Complaint  Patient presents with  . Otalgia    Left    HPI James Fritz is a 60 y.o. male.   HPI Patient presents today with left ear pain.  Patient reports chronic daily prolonged use of ear plugs due to the underlying nature of his work.  He reports recently having recurrent ear infections approximately every 6 months with at least 3 within the last year.  He changes his ear plugs frequently.  Previously treated with topical ear antibiotics.  Endorses some pressure, sensation of swelling and tragus pain he is afebrile. Patient also requests a refill of his blood pressure medicine as he is overdue for a visit with his primary care provider.  Recent labs last July 2021 with normal kidney functioning. Past Medical History:  Diagnosis Date  . Colon polyps   . CVA (cerebral vascular accident) (HCC)   . Hydrocephalus (HCC)   . Hypertension   . Mixed hyperlipidemia 06/22/2017   10-yr ASCVD risk 7%  . SAH (subarachnoid hemorrhage) (HCC) 2010    Patient Active Problem List   Diagnosis Date Noted  . Other fatigue 12/22/2019  . Otitis externa 12/22/2019  . Varicose veins of right lower extremity 12/22/2019  . Statin declined 08/16/2018  . Type 2 diabetes mellitus with hyperglycemia (HCC) 05/18/2018  . Mixed hyperlipidemia 06/22/2017  . Class 1 obesity due to excess calories with serious comorbidity and body mass index (BMI) of 32.0 to 32.9 in adult 05/08/2017  . Hypertension associated with diabetes (HCC) 12/21/2016  . History of CVA (cerebrovascular accident) 12/16/2016  . Hydrocephalus with operating shunt (HCC) 12/16/2016    Past Surgical History:  Procedure Laterality Date  . COLONOSCOPY W/ POLYPECTOMY    . CSF SHUNT    . VASECTOMY         Home Medications    Prior to Admission medications   Medication Sig Start Date End Date Taking? Authorizing  Provider  cefdinir (OMNICEF) 300 MG capsule Take 2 capsules (600 mg total) by mouth daily for 7 days. 08/10/20 08/17/20 Yes Bing Neighbors, FNP  atorvastatin (LIPITOR) 40 MG tablet Take 1 tablet (40 mg total) by mouth daily. 06/10/19   Sunnie Nielsen, DO  glucose blood test strip Use up to 4 times per day as directed with glucometer. Disp: 100. Refill x99 please dispense brand per insurance coverage/patient preference 07/19/19   Sunnie Nielsen, DO  Lancets [facility] ULTRASOFT) lancets USE AS INSTRUCTED 01/10/20   Sunnie Nielsen, DO  losartan (COZAAR) 50 MG tablet Take 1 tablet (50 mg total) by mouth daily. 08/10/20   Bing Neighbors, FNP  metFORMIN (GLUCOPHAGE-XR) 500 MG 24 hr tablet TAKE 2 TABLETS (1,000 MG TOTAL) BY MOUTH DAILY WITH BREAKFAST. DUE FOR FOLLOW UP VISIT W/PCP 07/12/20   Sunnie Nielsen, DO  neomycin-polymyxin-hydrocortisone (CORTISPORIN) OTIC solution Place 3 drops into both ears 4 (four) times daily. 12/22/19   Everrett Coombe, DO    Family History Family History  Problem Relation Age of Onset  . Diabetes Mother   . Diabetes Father   . Hypertension Father   . Schizophrenia Sister   . Hypertension Brother   . Heart disease Brother   . Prostate cancer Neg Hx     Social History Social History   Tobacco Use  . Smoking status: Former Smoker    Quit date: 06/23/1992  Years since quitting: 28.1  . Smokeless tobacco: Never Used  Substance Use Topics  . Alcohol use: Not Currently    Alcohol/week: 3.0 - 5.0 standard drinks    Types: 3 - 5 Standard drinks or equivalent per week  . Drug use: No     Allergies   Patient has no known allergies.   Review of Systems Review of Systems Pertinent negatives listed in HPI  Physical Exam Triage Vital Signs ED Triage Vitals  Enc Vitals Group     BP 08/10/20 1718 (!) 157/99     Pulse Rate 08/10/20 1718 80     Resp 08/10/20 1718 16     Temp 08/10/20 1718 98.5 F (36.9 C)     Temp Source 08/10/20 1718 Oral      SpO2 08/10/20 1718 97 %     Weight --      Height --      Head Circumference --      Peak Flow --      Pain Score 08/10/20 1717 4     Pain Loc --      Pain Edu? --      Excl. in GC? --    No data found.  Updated Vital Signs BP (!) 157/99 (BP Location: Right Arm)   Pulse 80   Temp 98.5 F (36.9 C) (Oral)   Resp 16   SpO2 97%   Visual Acuity Right Eye Distance:   Left Eye Distance:   Bilateral Distance:    Right Eye Near:   Left Eye Near:    Bilateral Near:     Physical Exam  General Appearance:    Alert, cooperative, no distress  HENT:   Normocephalic, Left ear effusion, tragus tenderness, canal swelling and pain, Right ear normal, nares mucosal edema with congestion, rhinorrhea, oropharynx    Eyes:    PERRL, conjunctiva/corneas clear, EOM's intact       Lungs:     Clear to auscultation bilaterally, respirations unlabored  Heart:    Regular rate and rhythm  Neurologic:   Awake, alert, oriented x 3. No apparent focal neurological           defect.      UC Treatments / Results  Labs (all labs ordered are listed, but only abnormal results are displayed) Labs Reviewed - No data to display  EKG   Radiology No results found.  Procedures Procedures (including critical care time)  Medications Ordered in UC Medications - No data to display  Initial Impression / Assessment and Plan / UC Course  I have reviewed the triage vital signs and the nursing notes.  Pertinent labs & imaging results that were available during my care of the patient were reviewed by me and considered in my medical decision making (see chart for details).     Infective acute otitis media treatment per discharge instructions.  Return cautions discussed.  Refilled blood pressure medication for 30 days patient is due to follow-up with primary care provider for hypertension follow-up advised to contact their office to schedule appointment within the next 30 days prior to current refill of  losartan running out. Final Clinical Impressions(s) / UC Diagnoses   Final diagnoses:  Other infective acute otitis externa of left ear  Medication refill     Discharge Instructions     If you actions persist I would recommend you follow-up with ENT.  I am treating you with a stronger course of treatment for this will completely resolve the  subsequent infections.   ED Prescriptions    Medication Sig Dispense Auth. Provider   losartan (COZAAR) 50 MG tablet Take 1 tablet (50 mg total) by mouth daily. 30 tablet Bing Neighbors, FNP   cefdinir (OMNICEF) 300 MG capsule Take 2 capsules (600 mg total) by mouth daily for 7 days. 14 capsule Bing Neighbors, FNP     PDMP not reviewed this encounter.   Bing Neighbors, [place] 08/12/20 [phone]

## 2020-08-17 ENCOUNTER — Encounter: Payer: Self-pay | Admitting: Osteopathic Medicine

## 2020-09-01 ENCOUNTER — Other Ambulatory Visit: Payer: Self-pay | Admitting: Family Medicine

## 2020-09-01 DIAGNOSIS — I152 Hypertension secondary to endocrine disorders: Secondary | ICD-10-CM

## 2020-09-01 DIAGNOSIS — E1159 Type 2 diabetes mellitus with other circulatory complications: Secondary | ICD-10-CM

## 2020-09-20 ENCOUNTER — Other Ambulatory Visit: Payer: Self-pay | Admitting: Osteopathic Medicine

## 2020-10-24 ENCOUNTER — Other Ambulatory Visit: Payer: Self-pay

## 2020-10-24 ENCOUNTER — Ambulatory Visit (INDEPENDENT_AMBULATORY_CARE_PROVIDER_SITE_OTHER): Payer: BC Managed Care – PPO | Admitting: Osteopathic Medicine

## 2020-10-24 VITALS — BP 155/107 | HR 88 | Temp 98.3°F | Wt 205.1 lb

## 2020-10-24 DIAGNOSIS — I152 Hypertension secondary to endocrine disorders: Secondary | ICD-10-CM

## 2020-10-24 DIAGNOSIS — Z8673 Personal history of transient ischemic attack (TIA), and cerebral infarction without residual deficits: Secondary | ICD-10-CM

## 2020-10-24 DIAGNOSIS — Z23 Encounter for immunization: Secondary | ICD-10-CM

## 2020-10-24 DIAGNOSIS — I8391 Asymptomatic varicose veins of right lower extremity: Secondary | ICD-10-CM

## 2020-10-24 DIAGNOSIS — E782 Mixed hyperlipidemia: Secondary | ICD-10-CM

## 2020-10-24 DIAGNOSIS — E1165 Type 2 diabetes mellitus with hyperglycemia: Secondary | ICD-10-CM | POA: Diagnosis not present

## 2020-10-24 DIAGNOSIS — E1159 Type 2 diabetes mellitus with other circulatory complications: Secondary | ICD-10-CM | POA: Diagnosis not present

## 2020-10-24 LAB — POCT GLYCOSYLATED HEMOGLOBIN (HGB A1C): Hemoglobin A1C: 13.6 % — AB (ref 4.0–5.6)

## 2020-10-24 NOTE — Progress Notes (Signed)
James Fritz is a 60 y.o. male who presents to  [facility] at [facility]  today, 10/24/20, seeking care for the following:  . Follow up diabetes      ASSESSMENT & PLAN with other pertinent findings:  The primary encounter diagnosis was Type 2 diabetes mellitus with hyperglycemia, without long-term current use of insulin (HCC). Diagnoses of Hypertension associated with diabetes (HCC), History of CVA (cerebrovascular accident), Asymptomatic varicose veins of right lower extremity, Mixed hyperlipidemia, and Need for shingles vaccine were also pertinent to this visit.   1. Type 2 diabetes mellitus with hyperglycemia, without long-term current use of insulin (HCC) Long discussion about natural history of DM2 and recommended aggressive treatment. Pt declines anything other than metformin despite discussion that early aggressive Glc control is advised. Pt aware risks/benefits and has decision making capacity   2. Hypertension associated with diabetes (HCC) Not at goal, declines Rx adjustment  3. History of CVA (cerebrovascular accident) ASA and statin advised   4. Asymptomatic varicose veins of right lower extremity Referral to vascular surgery offered, advised compression hose   5. Mixed hyperlipidemia Advised statin   6. Need for shingles vaccine done   There are no Patient Instructions on file for this visit.  Orders Placed This Encounter  Procedures  . Varicella-zoster vaccine IM (Shingrix)  . POCT HgB A1C    No orders of the defined types were placed in this encounter.    See below for relevant physical exam findings  See below for recent lab and imaging results reviewed  Medications, allergies, PMH, PSH, SocH, FamH reviewed below    Follow-up instructions: No follow-ups on file.                                        Exam:  BP (!) 155/107 (BP Location: Left Arm, Patient Position:  Sitting, Cuff Size: Normal)   Pulse 88   Temp 98.3 F (36.8 C) (Oral)   Wt 205 lb 1.9 oz (93 kg)   BMI 30.38 kg/m   Constitutional: VS see above. General Appearance: alert, well-developed, well-nourished, NAD  Neck: No masses, trachea midline.   Respiratory: Normal respiratory effort. no wheeze, no rhonchi, no rales  Cardiovascular: S1/S2 normal, no murmur, no rub/gallop auscultated. RRR.   Musculoskeletal: Gait normal. Symmetric and independent movement  of all extremities  Neurological: Normal balance/coordination. No tremor.  Skin: warm, dry, intact.   Psychiatric: Normal judgment/insight. Normal mood and affect. Oriented x3.   Current Meds  Medication Sig  . atorvastatin (LIPITOR) 40 MG tablet Take 1 tablet (40 mg total) by mouth daily.  Marland Kitchen glucose blood test strip Use up to 4 times per day as directed with glucometer. Disp: 100. Refill x99 please dispense brand per insurance coverage/patient preference  . Lancets (ONETOUCH ULTRASOFT) lancets USE AS INSTRUCTED  . losartan (COZAAR) 50 MG tablet Take 1 tablet (50 mg total) by mouth daily.  . metFORMIN (GLUCOPHAGE-XR) 500 MG 24 hr tablet TAKE 2 TABLETS (1,000 MG TOTAL) BY MOUTH DAILY WITH BREAKFAST. DUE FOR FOLLOW UP VISIT W/PCP  . neomycin-polymyxin-hydrocortisone (CORTISPORIN) OTIC solution Place 3 drops into both ears 4 (four) times daily.    No Known Allergies  Patient Active Problem List   Diagnosis Date Noted  . Other fatigue 12/22/2019  . Otitis externa 12/22/2019  . Varicose veins of right lower extremity 12/22/2019  . Statin declined  08/16/2018  . Type 2 diabetes mellitus with hyperglycemia (HCC) 05/18/2018  . Mixed hyperlipidemia 06/22/2017  . Class 1 obesity due to excess calories with serious comorbidity and body mass index (BMI) of 32.0 to 32.9 in adult 05/08/2017  . Hypertension associated with diabetes (HCC) 12/21/2016  . History of CVA (cerebrovascular accident) 12/16/2016  . Hydrocephalus with operating  shunt (HCC) 12/16/2016    Family History  Problem Relation Age of Onset  . Diabetes Mother   . Diabetes Father   . Hypertension Father   . Schizophrenia Sister   . Hypertension Brother   . Heart disease Brother   . Prostate cancer Neg Hx     Social History   Tobacco Use  Smoking Status Former Smoker  . Quit date: 06/23/1992  . Years since quitting: 28.3  Smokeless Tobacco Never Used    Past Surgical History:  Procedure Laterality Date  . COLONOSCOPY W/ POLYPECTOMY    . CSF SHUNT    . VASECTOMY      Immunization History  Administered Date(s) Administered  . Influenza,inj,Quad PF,6+ Mos 05/08/2017  . Influenza-Unspecified 05/08/2018, 04/23/2019  . Janssen (J&J) SARS-COV-2 Vaccination 09/03/2019  . Tdap 09/03/2017  . Zoster Recombinat (Shingrix) 10/24/2020    Recent Results (from the past 2160 hour(s))  HM COLONOSCOPY     Status: None   Collection Time: 07/31/20 12:00 AM  Result Value Ref Range   HM Colonoscopy See Report (in chart) See Report (in chart), Patient Reported  POCT HgB A1C     Status: Abnormal   Collection Time: 10/24/20  9:47 AM  Result Value Ref Range   Hemoglobin A1C 13.6 (A) 4.0 - 5.6 %   HbA1c POC (<> result, manual entry)     HbA1c, POC (prediabetic range)     HbA1c, POC (controlled diabetic range)      No results found.     All questions at time of visit were answered - patient instructed to contact office with any additional concerns or updates. ER/RTC precautions were reviewed with the patient as applicable.   Please note: manual typing as well as voice recognition software may have been used to produce this document - typos may escape review. Please contact Dr. Lyn Hollingshead for any needed clarifications.   Total encounter time on date of service, 10/24/20, was 40 minutes spent addressing problems/issues as noted above in Assessment & Plan, including time spent in discussion with patient regarding the HPI, ROS, confirming history, reviewing  Assessment & Plan, as well as time spent on coordination of care, record review.

## 2020-10-24 NOTE — Patient Instructions (Signed)
Diabetes Mellitus and Nutrition, Adult When you have diabetes, or diabetes mellitus, it is very important to have healthy eating habits because your blood sugar (glucose) levels are greatly affected by what you eat and drink. Eating healthy foods in the right amounts, at about the same times every day, can help you:  Control your blood glucose.  Lower your risk of heart disease.  Improve your blood pressure.  Reach or maintain a healthy weight. What can affect my meal plan? Every person with diabetes is different, and each person has different needs for a meal plan. Your health care provider may recommend that you work with a dietitian to make a meal plan that is best for you. Your meal plan may vary depending on factors such as:  The calories you need.  The medicines you take.  Your weight.  Your blood glucose, blood pressure, and cholesterol levels.  Your activity level.  Other health conditions you have, such as heart or kidney disease. How do carbohydrates affect me? Carbohydrates, also called carbs, affect your blood glucose level more than any other type of food. Eating carbs naturally raises the amount of glucose in your blood. Carb counting is a method for keeping track of how many carbs you eat. Counting carbs is important to keep your blood glucose at a healthy level, especially if you use insulin or take certain oral diabetes medicines. It is important to know how many carbs you can safely have in each meal. This is different for every person. Your dietitian can help you calculate how many carbs you should have at each meal and for each snack. How does alcohol affect me? Alcohol can cause a sudden decrease in blood glucose (hypoglycemia), especially if you use insulin or take certain oral diabetes medicines. Hypoglycemia can be a life-threatening condition. Symptoms of hypoglycemia, such as sleepiness, dizziness, and confusion, are similar to symptoms of having too much  alcohol.  Do not drink alcohol if: ? Your health care provider tells you not to drink. ? You are pregnant, may be pregnant, or are planning to become pregnant.  If you drink alcohol: ? Do not drink on an empty stomach. ? Limit how much you use to:  0-1 drink a day for women.  0-2 drinks a day for men. ? Be aware of how much alcohol is in your drink. In the [place] one drink equals one 12 oz bottle of beer (355 mL), one 5 oz glass of wine (148 mL), or one 1 oz glass of hard liquor (44 mL). ? Keep yourself hydrated with water, diet soda, or unsweetened iced tea.  Keep in mind that regular soda, juice, and other mixers may contain a lot of sugar and must be counted as carbs. What are tips for following this plan? Reading food labels  Start by checking the serving size on the "Nutrition Facts" label of packaged foods and drinks. The amount of calories, carbs, fats, and other nutrients listed on the label is based on one serving of the item. Many items contain more than one serving per package.  Check the total grams (g) of carbs in one serving. You can calculate the number of servings of carbs in one serving by dividing the total carbs by 15. For example, if a food has 30 g of total carbs per serving, it would be equal to 2 servings of carbs.  Check the number of grams (g) of saturated fats and trans fats in one serving. Choose foods that have   a low amount or none of these fats.  Check the number of milligrams (mg) of salt (sodium) in one serving. Most people should limit total sodium intake to less than 2,300 mg per day.  Always check the nutrition information of foods labeled as "low-fat" or "nonfat." These foods may be higher in added sugar or refined carbs and should be avoided.  Talk to your dietitian to identify your daily goals for nutrients listed on the label. Shopping  Avoid buying canned, pre-made, or processed foods. These foods tend to be high in fat, sodium, and added  sugar.  Shop around the outside edge of the grocery store. This is where you will most often find fresh fruits and vegetables, bulk grains, fresh meats, and fresh dairy. Cooking  Use low-heat cooking methods, such as baking, instead of high-heat cooking methods like deep frying.  Cook using healthy oils, such as olive, canola, or sunflower oil.  Avoid cooking with butter, cream, or high-fat meats. Meal planning  Eat meals and snacks regularly, preferably at the same times every day. Avoid going long periods of time without eating.  Eat foods that are high in fiber, such as fresh fruits, vegetables, beans, and whole grains. Talk with your dietitian about how many servings of carbs you can eat at each meal.  Eat 4-6 oz (112-168 g) of lean protein each day, such as lean meat, chicken, fish, eggs, or tofu. One ounce (oz) of lean protein is equal to: ? 1 oz (28 g) of meat, chicken, or fish. ? 1 egg. ?  cup (62 g) of tofu.  Eat some foods each day that contain healthy fats, such as avocado, nuts, seeds, and fish.   What foods should I eat? Fruits Berries. Apples. Oranges. Peaches. Apricots. Plums. Grapes. Mango. Papaya. Pomegranate. Kiwi. Cherries. Vegetables Lettuce. Spinach. Leafy greens, including kale, chard, collard greens, and mustard greens. Beets. Cauliflower. Cabbage. Broccoli. Carrots. Green beans. Tomatoes. Peppers. Onions. Cucumbers. Brussels sprouts. Grains Whole grains, such as whole-wheat or whole-grain bread, crackers, tortillas, cereal, and pasta. Unsweetened oatmeal. Quinoa. Brown or wild rice. Meats and other proteins Seafood. Poultry without skin. Lean cuts of poultry and beef. Tofu. Nuts. Seeds. Dairy Low-fat or fat-free dairy products such as milk, yogurt, and cheese. The items listed above may not be a complete list of foods and beverages you can eat. Contact a dietitian for more information. What foods should I avoid? Fruits Fruits canned with  syrup. Vegetables Canned vegetables. Frozen vegetables with butter or cream sauce. Grains Refined white flour and flour products such as bread, pasta, snack foods, and cereals. Avoid all processed foods. Meats and other proteins Fatty cuts of meat. Poultry with skin. Breaded or fried meats. Processed meat. Avoid saturated fats. Dairy Full-fat yogurt, cheese, or milk. Beverages Sweetened drinks, such as soda or iced tea. The items listed above may not be a complete list of foods and beverages you should avoid. Contact a dietitian for more information. Questions to ask a health care provider  Do I need to meet with a diabetes educator?  Do I need to meet with a dietitian?  What number can I call if I have questions?  When are the best times to check my blood glucose? Where to find more information:  American Diabetes Association: diabetes.org  Academy of Nutrition and Dietetics: www.eatright.org  National Institute of Diabetes and Digestive and Kidney Diseases: www.niddk.nih.gov  Association of Diabetes Care and Education Specialists: www.diabeteseducator.org Summary  It is important to have healthy eating   habits because your blood sugar (glucose) levels are greatly affected by what you eat and drink.  A healthy meal plan will help you control your blood glucose and maintain a healthy lifestyle.  Your health care provider may recommend that you work with a dietitian to make a meal plan that is best for you.  Keep in mind that carbohydrates (carbs) and alcohol have immediate effects on your blood glucose levels. It is important to count carbs and to use alcohol carefully. This information is not intended to replace advice given to you by your health care provider. Make sure you discuss any questions you have with your health care provider. Document Revised: 05/17/2019 Document Reviewed: 05/17/2019 Elsevier Patient Education  2021 Elsevier Inc.  Diabetes Mellitus and  Exercise Exercising regularly is important for overall health, especially for people who have diabetes mellitus. Exercising is not only about losing weight. It has many other health benefits, such as increasing muscle strength and bone density and reducing body fat and stress. This leads to improved fitness, flexibility, and endurance, all of which result in better overall health. What are the benefits of exercise if I have diabetes? Exercise has many benefits for people with diabetes. They include:  Helping to lower and control blood sugar (glucose).  Helping the body to respond better to the hormone insulin by improving insulin sensitivity.  Reducing how much insulin the body needs.  Lowering the risk for heart disease by: ? Lowering "bad" cholesterol and triglyceride levels. ? Increasing "good" cholesterol levels. ? Lowering blood pressure. ? Lowering blood glucose levels. What is my activity plan? Your health care provider or certified diabetes educator can help you make a plan for the type and frequency of exercise that works for you. This is called your activity plan. Be sure to:  Get at least 150 minutes of medium-intensity or high-intensity exercise each week. Exercises may include brisk walking, biking, or water aerobics.  Do stretching and strengthening exercises, such as yoga or weight lifting, at least 2 times a week.  Spread out your activity over at least 3 days of the week.  Get some form of physical activity each day. ? Do not go more than 2 days in a row without some kind of physical activity. ? Avoid being inactive for more than 90 minutes at a time. Take frequent breaks to walk or stretch.  Choose exercises or activities that you enjoy. Set realistic goals.  Start slowly and gradually increase your exercise intensity over time.   How do I manage my diabetes during exercise? Monitor your blood glucose  Check your blood glucose before and after exercising. If your  blood glucose is: ? 240 mg/dL (13.3 mmol/L) or higher before you exercise, check your urine for ketones. These are chemicals created by the liver. If you have ketones in your urine, do not exercise until your blood glucose returns to normal. ? 100 mg/dL (5.6 mmol/L) or lower, eat a snack containing 15-20 grams of carbohydrate. Check your blood glucose 15 minutes after the snack to make sure that your glucose level is above 100 mg/dL (5.6 mmol/L) before you start your exercise.  Know the symptoms of low blood glucose (hypoglycemia) and how to treat it. Your risk for hypoglycemia increases during and after exercise. Follow these tips and your health care provider's instructions  Keep a carbohydrate snack that is fast-acting for use before, during, and after exercise to help prevent or treat hypoglycemia.  Avoid injecting insulin into areas of the   body that are going to be exercised. For example, avoid injecting insulin into: ? Your arms, when you are about to play tennis. ? Your legs, when you are about to go jogging.  Keep records of your exercise habits. Doing this can help you and your health care provider adjust your diabetes management plan as needed. Write down: ? Food that you eat before and after you exercise. ? Blood glucose levels before and after you exercise. ? The type and amount of exercise you have done.  Work with your health care provider when you start a new exercise or activity. He or she may need to: ? Make sure that the activity is safe for you. ? Adjust your insulin, other medicines, and food that you eat.  Drink plenty of water while you exercise. This prevents loss of water (dehydration) and problems caused by a lot of heat in the body (heat stroke).   Where to find more information  American Diabetes Association: www.diabetes.org Summary  Exercising regularly is important for overall health, especially for people who have diabetes mellitus.  Exercising has many  health benefits. It increases muscle strength and bone density and reduces body fat and stress. It also lowers and controls blood glucose.  Your health care provider or certified diabetes educator can help you make an activity plan for the type and frequency of exercise that works for you.  Work with your health care provider to make sure any new activity is safe for you. Also work with your health care provider to adjust your insulin, other medicines, and the food you eat. This information is not intended to replace advice given to you by your health care provider. Make sure you discuss any questions you have with your health care provider. Document Revised: 03/07/2019 Document Reviewed: 03/07/2019 Elsevier Patient Education  2021 Elsevier Inc.  

## 2020-11-16 ENCOUNTER — Telehealth: Payer: Self-pay | Admitting: Neurology

## 2020-11-16 ENCOUNTER — Telehealth (INDEPENDENT_AMBULATORY_CARE_PROVIDER_SITE_OTHER): Payer: BC Managed Care – PPO | Admitting: Medical-Surgical

## 2020-11-16 ENCOUNTER — Encounter: Payer: Self-pay | Admitting: Medical-Surgical

## 2020-11-16 ENCOUNTER — Other Ambulatory Visit: Payer: Self-pay | Admitting: Family Medicine

## 2020-11-16 VITALS — BP 122/80 | Temp 98.5°F | Ht 69.0 in | Wt 205.0 lb

## 2020-11-16 DIAGNOSIS — B349 Viral infection, unspecified: Secondary | ICD-10-CM | POA: Diagnosis not present

## 2020-11-16 DIAGNOSIS — E1159 Type 2 diabetes mellitus with other circulatory complications: Secondary | ICD-10-CM

## 2020-11-16 MED ORDER — BENZONATATE 200 MG PO CAPS: 200.0000 mg | ORAL_CAPSULE | Freq: Three times a day (TID) | ORAL | 0 refills | Status: DC | PRN

## 2020-11-16 MED ORDER — AZELASTINE HCL 0.1 % NA SOLN: 2.0000 | Freq: Two times a day (BID) | NASAL | 1 refills | Status: DC

## 2020-11-16 MED ORDER — ALBUTEROL SULFATE HFA 108 (90 BASE) MCG/ACT IN AERS: 2.0000 | INHALATION_SPRAY | Freq: Four times a day (QID) | RESPIRATORY_TRACT | 0 refills | Status: DC | PRN

## 2020-11-16 NOTE — Progress Notes (Signed)
Woke up today with symptoms: Congestion Cough  Muscle aches Scratchy throat Minor chills  No headache, fevers No diarrhea/nausea/vomiting  Has not taken any OTC meds  Wife is getting him an OTC covid test to take

## 2020-11-16 NOTE — Telephone Encounter (Signed)
Patient wife called to let us know Covid test was negative.  She had flu 3 weeks ago and wanted to know if he should be tested for this. I made wife aware too late to test today in our office but can go to Urgent care over the weekend if he gets worse. She expressed understanding.

## 2020-11-16 NOTE — Progress Notes (Signed)
Virtual Visit via Video Note  I connected with James Fritz on 11/16/20 at  9:30 AM EDT by a video enabled telemedicine application and verified that I am speaking with the correct person using two identifiers.   I discussed the limitations of evaluation and management by telemedicine and the availability of in person appointments. The patient expressed understanding and agreed to proceed.  Patient location: home Provider locations: office  Subjective:    CC: Viral symptoms  HPI: Pleasant 60 year old male presenting via MyChart video visit with reports of viral symptoms that started yesterday evening and have worsened throughout the night.  He endorses body aches, scratchy throat, chills, cough, chest tightness, and sinus/chest congestion.  Has not any fevers, dyspnea at rest, headache, chest pain, or GI symptoms.  Has not tried any over-the-counter medications and has not COVID tested.  Reports his wife is going out to get a COVID test so he can do that at home.   Past medical history, Surgical history, Family history not pertinant except as noted below, Social history, Allergies, and medications have been entered into the medical record, reviewed, and corrections made.   Review of Systems: See HPI for pertinent positives and negatives.   Objective:    General: Speaking clearly in complete sentences without any shortness of breath.  Alert and oriented x3.  Normal judgment. No apparent acute distress.  Impression and Recommendations:    1. Viral illness Recommend COVID testing.  Advised patient to complete a COVID test and notify us of the results.  If he is positive, he would like to proceed with the oral antiviral medication.  He understands that this is under emergency authorization.  Discussed symptomatic treatment using over-the-counter remedies.  URI "shopping list" sent to patient via MyChart.  Reviewed emergency symptoms that should prompt urgent care/ER evaluation.  I  discussed the assessment and treatment plan with the patient. The patient was provided an opportunity to ask questions and all were answered. The patient agreed with the plan and demonstrated an understanding of the instructions.   The patient was advised to call back or seek an in-person evaluation if the symptoms worsen or if the condition fails to improve as anticipated.  20 minutes of non-face-to-face time was provided during this encounter.  Return if symptoms worsen or fail to improve.  Thayer Ohm, DNP, APRN, FNP-BC  [facility]

## 2020-11-17 ENCOUNTER — Other Ambulatory Visit: Payer: Self-pay | Admitting: Osteopathic Medicine

## 2020-11-19 ENCOUNTER — Emergency Department
Admission: RE | Admit: 2020-11-19 | Discharge: 2020-11-19 | Discharge status: Against Medical Advice | Payer: BC Managed Care – PPO | Source: Ambulatory Visit

## 2020-11-19 ENCOUNTER — Other Ambulatory Visit: Payer: Self-pay

## 2020-11-20 ENCOUNTER — Other Ambulatory Visit: Payer: Self-pay

## 2020-11-20 MED ORDER — METFORMIN HCL ER 500 MG PO TB24: ORAL_TABLET | ORAL | 0 refills | Status: DC

## 2020-11-23 ENCOUNTER — Other Ambulatory Visit: Payer: Self-pay

## 2020-11-23 DIAGNOSIS — I152 Hypertension secondary to endocrine disorders: Secondary | ICD-10-CM

## 2020-11-23 MED ORDER — LOSARTAN POTASSIUM 50 MG PO TABS: 50.0000 mg | ORAL_TABLET | Freq: Every day | ORAL | 1 refills | Status: DC

## 2020-11-27 ENCOUNTER — Encounter: Payer: Self-pay | Admitting: Osteopathic Medicine

## 2020-11-27 DIAGNOSIS — I8390 Asymptomatic varicose veins of unspecified lower extremity: Secondary | ICD-10-CM

## 2020-12-08 ENCOUNTER — Other Ambulatory Visit: Payer: Self-pay | Admitting: Medical-Surgical

## 2020-12-12 DIAGNOSIS — I87393 Chronic venous hypertension (idiopathic) with other complications of bilateral lower extremity: Secondary | ICD-10-CM | POA: Diagnosis not present

## 2020-12-13 ENCOUNTER — Other Ambulatory Visit: Payer: Self-pay | Admitting: Medical-Surgical

## 2020-12-15 ENCOUNTER — Other Ambulatory Visit: Payer: Self-pay | Admitting: Osteopathic Medicine

## 2020-12-21 ENCOUNTER — Other Ambulatory Visit: Payer: Self-pay | Admitting: Osteopathic Medicine

## 2020-12-21 DIAGNOSIS — R972 Elevated prostate specific antigen [PSA]: Secondary | ICD-10-CM | POA: Insufficient documentation

## 2020-12-21 LAB — CBC
HCT: 42 % (ref 38.5–50.0)
Hemoglobin: 14.1 g/dL (ref 13.2–17.1)
MCH: 29.9 pg (ref 27.0–33.0)
MCHC: 33.6 g/dL (ref 32.0–36.0)
MCV: 89 fL (ref 80.0–100.0)
MPV: 10.9 fL (ref 7.5–12.5)
Platelets: 198 10*3/uL (ref 140–400)
RBC: 4.72 10*6/uL (ref 4.20–5.80)
RDW: 12.7 % (ref 11.0–15.0)
WBC: 5.6 10*3/uL (ref 3.8–10.8)

## 2020-12-21 LAB — COMPLETE METABOLIC PANEL WITH GFR
AG Ratio: 2 (calc) (ref 1.0–2.5)
ALT: 14 U/L (ref 9–46)
AST: 13 U/L (ref 10–35)
Albumin: 4.2 g/dL (ref 3.6–5.1)
Alkaline phosphatase (APISO): 92 U/L (ref 35–144)
BUN/Creatinine Ratio: 15 (calc) (ref 6–22)
BUN: 10 mg/dL (ref 7–25)
CO2: 28 mmol/L (ref 20–32)
Calcium: 9 mg/dL (ref 8.6–10.3)
Chloride: 103 mmol/L (ref 98–110)
Creat: 0.65 mg/dL — ABNORMAL LOW (ref 0.70–1.33)
GFR, Est African American: 124 mL/min/{1.73_m2} (ref >=60)
GFR, Est Non African American: 107 mL/min/{1.73_m2} (ref >=60)
Globulin: 2.1 g/dL (calc) (ref 1.9–3.7)
Glucose, Bld: 253 mg/dL — ABNORMAL HIGH (ref 65–99)
Potassium: 4.2 mmol/L (ref 3.5–5.3)
Sodium: 138 mmol/L (ref 135–146)
Total Bilirubin: 0.6 mg/dL (ref 0.2–1.2)
Total Protein: 6.3 g/dL (ref 6.1–8.1)

## 2020-12-21 LAB — URINALYSIS
Bilirubin Urine: NEGATIVE
Hgb urine dipstick: NEGATIVE
Ketones, ur: NEGATIVE
Leukocytes,Ua: NEGATIVE
Nitrite: NEGATIVE
Protein, ur: NEGATIVE
Specific Gravity, Urine: 1.022 (ref 1.001–1.035)
pH: 5.5 (ref 5.0–8.0)

## 2020-12-21 LAB — LIPID PANEL
Cholesterol: 146 mg/dL (ref <200)
HDL: 34 mg/dL — ABNORMAL LOW (ref >=40)
LDL Cholesterol (Calc): 96 mg/dL (calc)
Non-HDL Cholesterol (Calc): 112 mg/dL (calc) (ref <130)
Total CHOL/HDL Ratio: 4.3 (calc) (ref <5.0)
Triglycerides: 72 mg/dL (ref <150)

## 2020-12-21 LAB — TSH: TSH: 2.1 mIU/L (ref 0.40–4.50)

## 2020-12-21 LAB — MICROALBUMIN / CREATININE URINE RATIO
Creatinine, Urine: 141 mg/dL (ref 20–320)
Microalb Creat Ratio: 4 mcg/mg creat (ref <30)
Microalb, Ur: 0.5 mg/dL

## 2020-12-21 LAB — REFLEX PSA, FREE
PSA, % Free: 8 % (calc) — ABNORMAL LOW (ref >25)
PSA, Free: 0.4 ng/mL

## 2020-12-21 LAB — PSA, TOTAL WITH REFLEX TO PSA, FREE: PSA, Total: 4.8 ng/mL — ABNORMAL HIGH (ref <=4.0)

## 2021-01-04 ENCOUNTER — Other Ambulatory Visit: Payer: Self-pay | Admitting: Osteopathic Medicine

## 2021-01-18 ENCOUNTER — Other Ambulatory Visit: Payer: Self-pay | Admitting: Osteopathic Medicine

## 2021-01-22 ENCOUNTER — Other Ambulatory Visit: Payer: Self-pay

## 2021-01-22 DIAGNOSIS — I8391 Asymptomatic varicose veins of right lower extremity: Secondary | ICD-10-CM

## 2021-02-08 ENCOUNTER — Inpatient Hospital Stay (HOSPITAL_COMMUNITY): Admission: RE | Admit: 2021-02-08 | Payer: BC Managed Care – PPO | Source: Ambulatory Visit

## 2021-05-27 ENCOUNTER — Ambulatory Visit (INDEPENDENT_AMBULATORY_CARE_PROVIDER_SITE_OTHER): Payer: 59 | Admitting: Family Medicine

## 2021-05-27 ENCOUNTER — Encounter: Payer: Self-pay | Admitting: Family Medicine

## 2021-05-27 ENCOUNTER — Other Ambulatory Visit: Payer: Self-pay

## 2021-05-27 VITALS — BP 160/103 | HR 70 | Temp 98.5°F | Ht 69.0 in | Wt 203.1 lb

## 2021-05-27 DIAGNOSIS — Z23 Encounter for immunization: Secondary | ICD-10-CM

## 2021-05-27 DIAGNOSIS — E782 Mixed hyperlipidemia: Secondary | ICD-10-CM | POA: Diagnosis not present

## 2021-05-27 DIAGNOSIS — I152 Hypertension secondary to endocrine disorders: Secondary | ICD-10-CM

## 2021-05-27 DIAGNOSIS — F419 Anxiety disorder, unspecified: Secondary | ICD-10-CM | POA: Diagnosis not present

## 2021-05-27 DIAGNOSIS — E1159 Type 2 diabetes mellitus with other circulatory complications: Secondary | ICD-10-CM | POA: Diagnosis not present

## 2021-05-27 DIAGNOSIS — E1165 Type 2 diabetes mellitus with hyperglycemia: Secondary | ICD-10-CM

## 2021-05-27 LAB — POCT GLYCOSYLATED HEMOGLOBIN (HGB A1C): Hemoglobin A1C: 13.2 % — AB (ref 4.0–5.6)

## 2021-05-27 MED ORDER — HYDROXYZINE PAMOATE 25 MG PO CAPS: 25.0000 mg | ORAL_CAPSULE | Freq: Three times a day (TID) | ORAL | 2 refills | Status: DC | PRN

## 2021-05-27 MED ORDER — ATORVASTATIN CALCIUM 40 MG PO TABS
40.0000 mg | ORAL_TABLET | Freq: Every day | ORAL | 1 refills | Status: DC
Start: 2021-05-27 — End: 2022-04-30

## 2021-05-27 MED ORDER — LOSARTAN POTASSIUM 50 MG PO TABS: 50.0000 mg | ORAL_TABLET | Freq: Every day | ORAL | 1 refills | Status: DC

## 2021-05-27 MED ORDER — SOLIQUA 100-33 UNT-MCG/ML ~~LOC~~ SOPN: 15.0000 [IU] | PEN_INJECTOR | Freq: Every day | SUBCUTANEOUS | 3 refills | Status: DC

## 2021-05-27 MED ORDER — METFORMIN HCL ER 500 MG PO TB24: ORAL_TABLET | ORAL | 1 refills | Status: DC

## 2021-05-27 NOTE — Assessment & Plan Note (Signed)
Blood pressure is not at goal at for age and co-morbidities. He has been out of medications for several weeks now - I recommend restarting current regimen and follow-up in 2 weeks.  In addition they were instructed on the following: - BP goal <130/80 - monitor and log blood pressures at home - check around the same time each day in a relaxed setting - Limit salt to <2000 mg/day - Follow DASH eating plan (heart healthy diet) - limit alcohol to 2 standard drinks per day for men and 1 per day for women - avoid tobacco products - get at least 2 hours of regular aerobic exercise weekly Patient aware of signs/symptoms requiring further/urgent evaluation. Labs updated today.

## 2021-05-27 NOTE — Assessment & Plan Note (Signed)
Poorly controlled with last A1c 13.6% 6 months ago. Recheck POCT A1c today is 13.2% Continue metformin Adding Soliqua 15 units daily. Titrate by 2-4 units every week until fasting glucose <130. Max of 60 units daily. Patient educated that there may be some nausea with starting this medication. Medication information sheet attached to AVS. Discussed diet and exercise F/u in 6 weeks. Bring home CBG logs.

## 2021-05-27 NOTE — Assessment & Plan Note (Signed)
Refilling Lipitor. 

## 2021-05-27 NOTE — Progress Notes (Signed)
Acute Office Visit  Subjective:    Patient ID: James Fritz, male    DOB: Nov 30, 1960, 60 y.o.   MRN: [id number]  Chief Complaint  Patient presents with   Verruca vulgaris    Left arm   Medication Refill   Anxiety    HPI Patient is in today for med refills, wart cryotherapy, anxiety.  Med refills: HYPERTENSION: - Medications: losartan - Compliance: ran out a few weeks ago - Checking BP at home: not recently - Denies any SOB, recurrent headaches, CP, vision changes, LE edema, dizziness, palpitations, or medication side effects. - Exercise: walking - Stressors: trying to figure out life/fulfillment after retiring   DIABETES: - Checking BG at home: not recently - Medications: metformin 1000 mg daily - Compliance: good - Denies symptoms of hypoglycemia, polyuria, polydipsia, numbness extremities, foot ulcers/trauma, wounds that are not healing, medication side effects    WART: Patient reports a left upper arm wart that he has been trying to use OTC treatment on for several months with no improvement. He would like it frozen today. Denies any surrounding edema, erythema, warmth, drainage, pain.     ANXIETY: Patient reports his anxiety has picked back up since retiring from work over the summer and not feeling "fulfilled" with daily life activities. States he can mostly manage with exercise, but he would like to try hydroxyzine again as he recalls this helping calm him down in the past. He denies any suicidal/homicidal ideation. States he does not feel like he needs a daily medication or counseling at this time; he just wants something to have on hand every once in a while.    PHQ9 SCORE ONLY 05/27/2021 08/16/2018 05/12/2018  PHQ-9 Total Score 5 7 4    GAD 7 : Generalized Anxiety Score 05/27/2021 08/16/2018 05/12/2018  Nervous, Anxious, on Edge 2 1 0  Control/stop worrying 0 1 0  Worry too much - different things 1 1 1   Trouble relaxing 2 1 1   Restless 0 1 0  Easily annoyed  or irritable 1 1 0  Afraid - awful might happen 0 0 0  Total GAD 7 Score 6 6 2   Anxiety Difficulty Not difficult at all Somewhat difficult -      Past Medical History:  Diagnosis Date   Colon polyps    CVA (cerebral vascular accident) (HCC)    Hydrocephalus (HCC)    Hypertension    Mixed hyperlipidemia 06/22/2017   10-yr ASCVD risk 7%   SAH (subarachnoid hemorrhage) (HCC) 2010    Past Surgical History:  Procedure Laterality Date   COLONOSCOPY W/ POLYPECTOMY     CSF SHUNT     VASECTOMY      Family History  Problem Relation Age of Onset   Diabetes Mother    Diabetes Father    Hypertension Father    Schizophrenia Sister    Hypertension Brother    Heart disease Brother    Prostate cancer Neg Hx     Social History   Socioeconomic History   Marital status: Married    Spouse name: Not on file   Number of children: Not on file   Years of education: Not on file   Highest education level: Not on file  Occupational History   Not on file  Tobacco Use   Smoking status: Former    Types: Cigarettes    Quit date: 06/23/1992    Years since quitting: 28.9   Smokeless tobacco: Never  Substance and Sexual Activity   Alcohol use:  Not Currently    Alcohol/week: 3.0 - 5.0 standard drinks    Types: 3 - 5 Standard drinks or equivalent per week   Drug use: No   Sexual activity: Yes    Partners: Female  Other Topics Concern   Not on file  Social History Narrative   Not on file   Social Determinants of Health   Financial Resource Strain: Not on file  Food Insecurity: Not on file  Transportation Needs: Not on file  Physical Activity: Not on file  Stress: Not on file  Social Connections: Not on file  Intimate Partner Violence: Not on file    Outpatient Medications Prior to Visit  Medication Sig Dispense Refill   albuterol (VENTOLIN HFA) 108 (90 Base) MCG/ACT inhaler INHALE 2 PUFFS INTO THE LUNGS EVERY 6 HOURS AS NEEDED FOR WHEEZE 18 each 3   Azelastine HCl 137 MCG/SPRAY  SOLN PLACE 2 SPRAYS INTO BOTH NOSTRILS 2 (TWO) TIMES DAILY. USE IN EACH NOSTRIL AS DIRECTED 30 mL 1   benzonatate (TESSALON) 200 MG capsule Take 1 capsule (200 mg total) by mouth 3 (three) times daily as needed for cough. 45 capsule 0   glucose blood test strip Use up to 4 times per day as directed with glucometer. Disp: 100. Refill x99 please dispense brand per insurance coverage/patient preference 300 strip 99   Lancets (ONETOUCH ULTRASOFT) lancets USE AS INSTRUCTED 100 each 10   atorvastatin (LIPITOR) 40 MG tablet TAKE 1 TABLET DAILY 90 tablet 1   losartan (COZAAR) 50 MG tablet Take 1 tablet (50 mg total) by mouth daily. 90 tablet 1   metFORMIN (GLUCOPHAGE-XR) 500 MG 24 hr tablet TAKE 2 TABLETS (1,000 MG TOTAL) BY MOUTH DAILY WITH BREAKFAST. 180 tablet 1   No facility-administered medications prior to visit.    No Known Allergies  Review of Systems All review of systems negative except what is listed in the HPI      Objective:    Physical Exam Vitals reviewed.  Constitutional:      Appearance: Normal appearance. He is normal weight.  HENT:     Head: Normocephalic and atraumatic.  Cardiovascular:     Rate and Rhythm: Normal rate and regular rhythm.     Heart sounds: Normal heart sounds.  Pulmonary:     Effort: Pulmonary effort is normal.     Breath sounds: Normal breath sounds.  Abdominal:     Palpations: Abdomen is soft.  Musculoskeletal:        General: Normal range of motion.  Skin:    General: Skin is warm and dry.     Findings: No rash.     Comments: Left upper arm with <1cm wart  Neurological:     General: No focal deficit present.     Mental Status: He is alert and oriented to person, place, and time.  Psychiatric:        Behavior: Behavior normal.        Thought Content: Thought content normal.        Judgment: Judgment normal.    BP (!) 160/103 (BP Location: Left Arm, Patient Position: Sitting, Cuff Size: Normal)   Pulse 70   Temp 98.5 F (36.9 C) (Oral)    Ht  (1.753 m)   Wt 203 lb 1.9 oz (92.1 kg)   SpO2 98%   BMI 30.00 kg/m  Wt Readings from Last 3 Encounters:  05/27/21 203 lb 1.9 oz (92.1 kg)  11/16/20 205 lb (93 kg)  10/24/20 205  lb 1.9 oz (93 kg)    Health Maintenance Due  Topic Date Due   FOOT EXAM  05/25/2019   OPHTHALMOLOGY EXAM  08/23/2019    There are no preventive care reminders to display for this patient.   Lab Results  Component Value Date   TSH 2.10 12/20/2020   Lab Results  Component Value Date   WBC 5.6 12/20/2020   HGB 14.1 12/20/2020   HCT 42.0 12/20/2020   MCV 89.0 12/20/2020   PLT 198 12/20/2020   Lab Results  Component Value Date   NA 138 12/20/2020   K 4.2 12/20/2020   CO2 28 12/20/2020   GLUCOSE 253 (H) 12/20/2020   BUN 10 12/20/2020   CREATININE 0.65 (L) 12/20/2020   BILITOT 0.6 12/20/2020   AST 13 12/20/2020   ALT 14 12/20/2020   PROT 6.3 12/20/2020   CALCIUM 9.0 12/20/2020   Lab Results  Component Value Date   CHOL 146 12/20/2020   Lab Results  Component Value Date   HDL 34 (L) 12/20/2020   Lab Results  Component Value Date   LDLCALC 96 12/20/2020   Lab Results  Component Value Date   TRIG 72 12/20/2020   Lab Results  Component Value Date   CHOLHDL 4.3 12/20/2020   Lab Results  Component Value Date   HGBA1C 13.2 (A) 05/27/2021       Assessment & Plan:   Hypertension associated with diabetes (HCC) Blood pressure is not at goal at for age and co-morbidities. He has been out of medications for several weeks now - I recommend restarting current regimen and follow-up in 2 weeks.  In addition they were instructed on the following: - BP goal <130/80 - monitor and log blood pressures at home - check around the same time each day in a relaxed setting - Limit salt to <2000 mg/day - Follow DASH eating plan (heart healthy diet) - limit alcohol to 2 standard drinks per day for men and 1 per day for women - avoid tobacco products - get at least 2 hours of regular  aerobic exercise weekly Patient aware of signs/symptoms requiring further/urgent evaluation. Labs updated today.   Type 2 diabetes mellitus with hyperglycemia (HCC) Poorly controlled with last A1c 13.6% 6 months ago. Recheck POCT A1c today is 13.2% Continue metformin Adding Soliqua 15 units daily. Titrate by 2-4 units every week until fasting glucose <130. Max of 60 units daily. Patient educated that there may be some nausea with starting this medication. Medication information sheet attached to AVS. Discussed diet and exercise F/u in 6 weeks. Bring home CBG logs.   Mixed hyperlipidemia Refilling Lipitor.   Anxiety Adding PRN hydroxyzine as this has worked well for him in the past. Discussed option to add daily medication and counseling in the future if needed. Recommend he continue regular exercise to help with mood.      Meds ordered this encounter  Medications   hydrOXYzine (VISTARIL) 25 MG capsule    Sig: Take 1 capsule (25 mg total) by mouth every 8 (eight) hours as needed.    Dispense:  30 capsule    Refill:  2   atorvastatin (LIPITOR) 40 MG tablet    Sig: Take 1 tablet (40 mg total) by mouth daily.    Dispense:  90 tablet    Refill:  1   losartan (COZAAR) 50 MG tablet    Sig: Take 1 tablet (50 mg total) by mouth daily.    Dispense:  90 tablet  Refill:  1   metFORMIN (GLUCOPHAGE-XR) 500 MG 24 hr tablet    Sig: TAKE 2 TABLETS (1,000 MG TOTAL) BY MOUTH DAILY WITH BREAKFAST.    Dispense:  180 tablet    Refill:  1   Insulin Glargine-Lixisenatide (SOLIQUA) 100-33 UNT-MCG/ML SOPN    Sig: Inject 15 Units into the skin daily. Increase by 2 units once per week until fasting glucose <130. Max dose 60 units daily. Give < 1 hour before first meal of the day.    Dispense:  3 mL    Refill:  3     Follow-up in 2 weeks: nurse visit BP check Follow-up in 6 weeks: diabetes f/u and establish care with new provider Follow-up sooner if needed  Clayborne Dana, NP

## 2021-05-27 NOTE — Assessment & Plan Note (Addendum)
Adding PRN hydroxyzine as this has worked well for him in the past. Discussed option to add daily medication and counseling in the future if needed. Recommend he continue regular exercise to help with mood.

## 2021-05-27 NOTE — Patient Instructions (Signed)
Starting [place] injections for diabetes. Start with 15 units injected subcutaneously daily (less than an hour before breakfast). Increase by 2 units every week until fasting glucose < 130. Max of 60 units injected per day.  Refills for other medications placed.  Monitor BP daily, about 2 hours after taking medications.  Hydroxyzine sent in for anxiety   Follow-up in 2 weeks for nurse visit to check blood pressure Follow-up in 6 weeks for diabetes and to establish care with new PCP

## 2021-05-28 LAB — COMPLETE METABOLIC PANEL WITH GFR
AG Ratio: 1.7 (calc) (ref 1.0–2.5)
ALT: 17 U/L (ref 9–46)
AST: 14 U/L (ref 10–35)
Albumin: 4.1 g/dL (ref 3.6–5.1)
Alkaline phosphatase (APISO): 93 U/L (ref 35–144)
BUN/Creatinine Ratio: 16 (calc) (ref 6–22)
BUN: 11 mg/dL (ref 7–25)
CO2: 28 mmol/L (ref 20–32)
Calcium: 9.3 mg/dL (ref 8.6–10.3)
Chloride: 99 mmol/L (ref 98–110)
Creat: 0.68 mg/dL — ABNORMAL LOW (ref 0.70–1.30)
Globulin: 2.4 g/dL (calc) (ref 1.9–3.7)
Glucose, Bld: 279 mg/dL — ABNORMAL HIGH (ref 65–99)
Potassium: 3.9 mmol/L (ref 3.5–5.3)
Sodium: 136 mmol/L (ref 135–146)
Total Bilirubin: 0.8 mg/dL (ref 0.2–1.2)
Total Protein: 6.5 g/dL (ref 6.1–8.1)
eGFR: 108 mL/min/{1.73_m2} (ref >=60)

## 2021-05-30 ENCOUNTER — Ambulatory Visit: Payer: 59 | Admitting: Family Medicine

## 2021-06-03 ENCOUNTER — Other Ambulatory Visit: Payer: Self-pay | Admitting: Family Medicine

## 2021-06-03 DIAGNOSIS — F419 Anxiety disorder, unspecified: Secondary | ICD-10-CM

## 2021-06-10 ENCOUNTER — Ambulatory Visit: Payer: 59

## 2021-06-23 ENCOUNTER — Other Ambulatory Visit: Payer: Self-pay | Admitting: Family Medicine

## 2021-06-23 DIAGNOSIS — F419 Anxiety disorder, unspecified: Secondary | ICD-10-CM

## 2021-06-27 ENCOUNTER — Ambulatory Visit: Payer: 59

## 2021-07-11 ENCOUNTER — Ambulatory Visit: Payer: 59 | Admitting: Family Medicine

## 2021-09-24 DIAGNOSIS — J029 Acute pharyngitis, unspecified: Secondary | ICD-10-CM | POA: Diagnosis not present

## 2022-01-29 LAB — HM DIABETES EYE EXAM

## 2022-02-13 ENCOUNTER — Other Ambulatory Visit: Payer: Self-pay | Admitting: Family Medicine

## 2022-02-13 DIAGNOSIS — E1159 Type 2 diabetes mellitus with other circulatory complications: Secondary | ICD-10-CM

## 2022-03-06 ENCOUNTER — Encounter: Payer: Self-pay | Admitting: Family Medicine

## 2022-03-27 DIAGNOSIS — K219 Gastro-esophageal reflux disease without esophagitis: Secondary | ICD-10-CM | POA: Diagnosis not present

## 2022-03-27 DIAGNOSIS — R69 Illness, unspecified: Secondary | ICD-10-CM | POA: Diagnosis not present

## 2022-03-27 DIAGNOSIS — Z87891 Personal history of nicotine dependence: Secondary | ICD-10-CM | POA: Diagnosis not present

## 2022-03-27 DIAGNOSIS — E785 Hyperlipidemia, unspecified: Secondary | ICD-10-CM | POA: Diagnosis not present

## 2022-03-27 DIAGNOSIS — Z7984 Long term (current) use of oral hypoglycemic drugs: Secondary | ICD-10-CM | POA: Diagnosis not present

## 2022-03-27 DIAGNOSIS — I10 Essential (primary) hypertension: Secondary | ICD-10-CM | POA: Diagnosis not present

## 2022-03-27 DIAGNOSIS — Z809 Family history of malignant neoplasm, unspecified: Secondary | ICD-10-CM | POA: Diagnosis not present

## 2022-03-27 DIAGNOSIS — E119 Type 2 diabetes mellitus without complications: Secondary | ICD-10-CM | POA: Diagnosis not present

## 2022-03-27 DIAGNOSIS — Z8249 Family history of ischemic heart disease and other diseases of the circulatory system: Secondary | ICD-10-CM | POA: Diagnosis not present

## 2022-03-27 DIAGNOSIS — M1990 Unspecified osteoarthritis, unspecified site: Secondary | ICD-10-CM | POA: Diagnosis not present

## 2022-03-27 DIAGNOSIS — Z8673 Personal history of transient ischemic attack (TIA), and cerebral infarction without residual deficits: Secondary | ICD-10-CM | POA: Diagnosis not present

## 2022-04-16 ENCOUNTER — Ambulatory Visit (INDEPENDENT_AMBULATORY_CARE_PROVIDER_SITE_OTHER): Payer: 59

## 2022-04-16 ENCOUNTER — Ambulatory Visit (INDEPENDENT_AMBULATORY_CARE_PROVIDER_SITE_OTHER): Payer: 59 | Admitting: Sports Medicine

## 2022-04-16 DIAGNOSIS — M7541 Impingement syndrome of right shoulder: Secondary | ICD-10-CM | POA: Diagnosis not present

## 2022-04-16 DIAGNOSIS — M75101 Unspecified rotator cuff tear or rupture of right shoulder, not specified as traumatic: Secondary | ICD-10-CM | POA: Insufficient documentation

## 2022-04-16 DIAGNOSIS — M25511 Pain in right shoulder: Secondary | ICD-10-CM | POA: Diagnosis not present

## 2022-04-16 MED ORDER — MELOXICAM 15 MG PO TABS: ORAL_TABLET | ORAL | 3 refills | Status: DC

## 2022-04-16 NOTE — Assessment & Plan Note (Signed)
This is a pleasant 61 year old male, chronic right shoulder pain, localized over the deltoid and anteriorly, impingement signs on exam, weakness to external rotation suggestive of rotator cuff tear, we will start conservatively, meloxicam, x-rays, formal physical therapy, return to see me in 6 weeks, injection if not better.

## 2022-04-16 NOTE — Progress Notes (Signed)
    Procedures performed today:    None.  Independent interpretation of notes and tests performed by another provider:   None.  Brief History, Exam, Impression, and Recommendations:    Impingement syndrome, shoulder, right This is a pleasant 61 year old male, chronic right shoulder pain, localized over the deltoid and anteriorly, impingement signs on exam, weakness to external rotation suggestive of rotator cuff tear, we will start conservatively, meloxicam, x-rays, formal physical therapy, return to see me in 6 weeks, injection if not better.  Chronic process with exacerbation and pharmacologic intervention  ____________________________________________ Gwen Her. Dianah Field, M.D., ABFM., CAQSM., AME. Primary Care and Sports Medicine [facility]  Adjunct Professor of [place] [employer] [facility] [employer]  Risk manager

## 2022-04-22 ENCOUNTER — Ambulatory Visit: Payer: 59 | Attending: Sports Medicine | Admitting: Rehabilitative and Restorative Service Providers"

## 2022-04-22 ENCOUNTER — Encounter: Payer: Self-pay | Admitting: Rehabilitative and Restorative Service Providers"

## 2022-04-22 ENCOUNTER — Other Ambulatory Visit: Payer: Self-pay

## 2022-04-22 DIAGNOSIS — G8929 Other chronic pain: Secondary | ICD-10-CM

## 2022-04-22 DIAGNOSIS — R29898 Other symptoms and signs involving the musculoskeletal system: Secondary | ICD-10-CM | POA: Diagnosis not present

## 2022-04-22 DIAGNOSIS — M25511 Pain in right shoulder: Secondary | ICD-10-CM | POA: Diagnosis not present

## 2022-04-22 DIAGNOSIS — M25811 Other specified joint disorders, right shoulder: Secondary | ICD-10-CM | POA: Diagnosis present

## 2022-04-22 DIAGNOSIS — R293 Abnormal posture: Secondary | ICD-10-CM | POA: Diagnosis not present

## 2022-04-22 NOTE — Therapy (Signed)
OUTPATIENT PHYSICAL THERAPY SHOULDER EVALUATION   Patient Name: Rien Patrice MRN: [id number] DOB:01-20-1961, 61 y.o., male Today's Date: 04/22/2022   PT End of Session - 04/22/22 0802     Visit Number 1    Number of Visits 16    Date for PT Re-Evaluation 06/17/22             Past Medical History:  Diagnosis Date   Colon polyps    CVA (cerebral vascular accident) ([place])    Hydrocephalus ([place])    Hypertension    Mixed hyperlipidemia 06/22/2017   10-yr ASCVD risk 7%   SAH (subarachnoid hemorrhage) ([place]) 2010   Past Surgical History:  Procedure Laterality Date   COLONOSCOPY W/ POLYPECTOMY     CSF SHUNT     VASECTOMY     Patient Active Problem List   Diagnosis Date Noted   Impingement syndrome, shoulder, right 04/16/2022   Anxiety 05/27/2021   Elevated PSA to 4.8, urology referral placed 12/21/20 12/21/2020   Other fatigue 12/22/2019   Otitis externa 12/22/2019   Varicose veins of right lower extremity 12/22/2019   Statin declined 08/16/2018   Type 2 diabetes mellitus with hyperglycemia ([place]) 05/18/2018   Hemorrhage into subarachnoid space of neuraxis ([place]) 03/02/2018   Mixed hyperlipidemia 06/22/2017   Class 1 obesity due to excess calories with serious comorbidity and body mass index (BMI) of 32.0 to 32.9 in adult 05/08/2017   Hypertension associated with diabetes ([place]) 12/21/2016   History of CVA (cerebrovascular accident) 12/16/2016   Hydrocephalus with operating shunt ([place]) 12/16/2016    PCP: Dr Emeterio Reeve  REFERRING PROVIDER: Dr Alta Corning  REFERRING DIAG: Rt shoulder impingement  THERAPY DIAG:  Impingement of right shoulder  Chronic right shoulder pain  Other symptoms and signs involving the musculoskeletal system  Abnormal posture  Rationale for Evaluation and Treatment: Rehabilitation  ONSET DATE: 09/21/21  SUBJECTIVE:                                                                                                                                                                                       SUBJECTIVE STATEMENT: Patient reports that he has had Rt shoulder pain for the past 6 months with no known injury.   PERTINENT HISTORY: HTN,   PAIN:  Are you having pain? Yes: NPRS scale: 2-3/10 Pain location: anterior Rt shoulder  Pain description: sharp; throbbing; nagging Aggravating factors: sitting in recliner; sleeping Relieving factors: meds; stretching; ice   PRECAUTIONS: None  WEIGHT BEARING RESTRICTIONS: No  FALLS:  Has patient fallen in last 6 months? 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   COGNITION: Overall cognitive status: Within functional limits for tasks assessed     SENSATION: WFL  POSTURE: Patient presents with head forward posture with increased thoracic kyphosis; shoulders rounded and elevated; scapulae abducted and rotated along the thoracic spine; head of the humerus anterior in orientation.   UPPER EXTREMITY ROM:   Active ROM Right eval Left eval  Shoulder flexion 140 145  Shoulder extension 48 60  Shoulder abduction 150 160  Shoulder adduction    Shoulder internal rotation Thumb T11 Thumb T8  Shoulder external rotation 70 painful  88  Elbow flexion    Elbow extension    Wrist flexion    Wrist extension    Wrist ulnar deviation    Wrist radial deviation    Wrist pronation    Wrist supination    (Blank rows = not tested)  UPPER EXTREMITY MMT: UE strength is WNL's bilat pain free  with resistive testing   PALPATION:  Muscular tightness Rt > Lt through the pecs; biceps; upper trap; leveator; teres    TODAY'S TREATMENT:                                                                                                                                         DATE: 04/22/22:  Postural correction and education in sitting standing using swim noodle   THEREX:  Chin tuck 10 sec x 10 Scap squeeze 10 sec x 10 L's x 10 W's x 10  Pec stretch - doorway 3 positions 30 sec x 2 reps each position   PATIENT EDUCATION: Education details: POC HEP DN myofacial ball release  Person educated: Patient Education method: Consulting civil engineer, Demonstration, Tactile cues, Verbal cues, and Handouts Education comprehension: verbalized understanding, returned demonstration, verbal cues required, tactile cues required, and needs further education  HOME EXERCISE PROGRAM: Access Code: [email or web address] URL: [email or web address] Date: 04/22/2022 Prepared by: Gillermo Murdoch  Exercises - Seated Cervical Retraction  - 2 x daily - 7 x weekly - 1-2 sets - 5-10 reps - 10 sec  hold - Seated Scapular Retraction  - 2 x daily - 7 x weekly - 1-2 sets - 10 reps - 10 sec  hold - Shoulder External Rotation and Scapular Retraction  - 3 x daily - 7 x weekly - 1 sets - 10 reps - 3-5 sec   hold - Shoulder External Rotation in 45 Degrees Abduction  - 2 x daily - 7 x weekly - 1-2 sets - 10 reps - 3 sec  hold - Doorway  Pec Stretch at 60 Degrees Abduction  - 3 x daily - 7 x weekly - 1 sets - 3 reps - Doorway Pec Stretch at 90 Degrees Abduction  - 3 x daily - 7 x weekly - 1 sets - 3 reps - 30 seconds  hold - Doorway Pec Stretch at 120 Degrees Abduction  - 3 x daily - 7 x weekly - 1 sets - 3 reps - 30 second hold  hold  Patient Education - Trigger Point Dry Needling - Office Posture - Posture and Body Mechanics  ASSESSMENT:  CLINICAL IMPRESSION: Patient is a 61 y.o male who was seen today for physical therapy  evaluation and treatment for Rt shoulder pain which has been present for the past 6 months with no known injury. Patient has poor posture and alignment; limited cervical and Rt > Lt shoulder ROM; muscular tightness and pain with palpation Rt shoulder; pain with functional activities and with sleeping. Patient will benefit from PT to address problems identified.   OBJECTIVE IMPAIRMENTS: decreased mobility, decreased ROM, impaired flexibility, impaired UE functional use, improper body mechanics, postural dysfunction, and pain.   ACTIVITY LIMITATIONS: carrying, lifting, sitting, sleeping, and reach over head  PARTICIPATION LIMITATIONS: sitting and sleepint  PERSONAL FACTORS: HTN; chronic nature of symptoms; postural changes   REHAB POTENTIAL: Good  CLINICAL DECISION MAKING: Stable/uncomplicated  EVALUATION COMPLEXITY: Low   GOALS: Goals reviewed with patient? Yes  SHORT TERM GOALS: Target date: 05/20/2022   Independent in initial HEP Baseline: Goal status: INITIAL  2.  Demonstrate and verbalize improved positions and postures for functional activities and rest  Baseline:  Goal status: INITIAL   LONG TERM GOALS: Target date: 06/17/2022    Decreased Rt shoulder pain by 75-100% allowing patient to return to all normal functional activities without limitations Baseline:  Goal status: INITIAL  2.  Increased AROM Rt shoulder to =/> than AROM Lt shoulder with no pain or discomfort  Baseline:  Goal status: INITIAL  3.  Improve posture and alignment with patient to demonstrate improved upright posture with posterior shoulder girdle musculature engaged  Baseline:  Goal status: INITIAL  4.  Patient reports ability to sleep for 6-7 hours without awakening due to pain or with increased pain in Rt shoulder  Baseline:  Goal status: INITIAL  5.  Independent in HEP  Baseline:  Goal status: INITIAL   PLAN:  PT FREQUENCY: 2x/week  PT DURATION: 8 weeks  PLANNED INTERVENTIONS:  Therapeutic exercises, Therapeutic activity, Neuromuscular re-education, Patient/Family education, Self Care, Joint mobilization, Aquatic Therapy, Dry Needling, Electrical stimulation, Cryotherapy, Moist heat, Taping, Vasopneumatic device, Ultrasound, Ionotophoresis 4mg /ml Dexamethasone, Manual therapy, and Re-evaluation  PLAN FOR NEXT SESSION: review and progress with postural correction and therapeutic exercise program; manual work vs DN Rt shoulder girdle; modalities as indicated    Fleetwood Pierron Nilda Simmer, PT, MPH  04/22/2022, 8:02 AM

## 2022-04-28 ENCOUNTER — Encounter: Payer: Self-pay | Admitting: Rehabilitative and Restorative Service Providers"

## 2022-04-28 ENCOUNTER — Ambulatory Visit: Payer: 59 | Attending: Sports Medicine | Admitting: Rehabilitative and Restorative Service Providers"

## 2022-04-28 DIAGNOSIS — R29898 Other symptoms and signs involving the musculoskeletal system: Secondary | ICD-10-CM | POA: Insufficient documentation

## 2022-04-28 DIAGNOSIS — M25811 Other specified joint disorders, right shoulder: Secondary | ICD-10-CM | POA: Diagnosis present

## 2022-04-28 DIAGNOSIS — G8929 Other chronic pain: Secondary | ICD-10-CM | POA: Diagnosis present

## 2022-04-28 DIAGNOSIS — M25511 Pain in right shoulder: Secondary | ICD-10-CM | POA: Insufficient documentation

## 2022-04-28 DIAGNOSIS — R293 Abnormal posture: Secondary | ICD-10-CM | POA: Insufficient documentation

## 2022-04-28 NOTE — Therapy (Signed)
OUTPATIENT PHYSICAL THERAPY SHOULDER EVALUATION   Patient Name: James Fritz MRN: [id number] DOB:08/20/1960, 61 y.o., male Today's Date: 04/28/2022   PT End of Session - 04/28/22 0850     Visit Number 2    Number of Visits 16    Date for PT Re-Evaluation 06/17/22    PT Start Time 0845    PT Stop Time 0933    PT Time Calculation (min) 48 min    Activity Tolerance Patient tolerated treatment well             Past Medical History:  Diagnosis Date   Colon polyps    CVA (cerebral vascular accident) (HCC)    Hydrocephalus (HCC)    Hypertension    Mixed hyperlipidemia 06/22/2017   10-yr ASCVD risk 7%   SAH (subarachnoid hemorrhage) (HCC) 2010   Past Surgical History:  Procedure Laterality Date   COLONOSCOPY W/ POLYPECTOMY     CSF SHUNT     VASECTOMY     Patient Active Problem List   Diagnosis Date Noted   Impingement syndrome, shoulder, right 04/16/2022   Anxiety 05/27/2021   Elevated PSA to 4.8, urology referral placed 12/21/20 12/21/2020   Other fatigue 12/22/2019   Otitis externa 12/22/2019   Varicose veins of right lower extremity 12/22/2019   Statin declined 08/16/2018   Type 2 diabetes mellitus with hyperglycemia (HCC) 05/18/2018   Hemorrhage into subarachnoid space of neuraxis (HCC) 03/02/2018   Mixed hyperlipidemia 06/22/2017   Class 1 obesity due to excess calories with serious comorbidity and body mass index (BMI) of 32.0 to 32.9 in adult 05/08/2017   Hypertension associated with diabetes (HCC) 12/21/2016   History of CVA (cerebrovascular accident) 12/16/2016   Hydrocephalus with operating shunt (HCC) 12/16/2016    PCP: Dr Sunnie Nielsen  REFERRING PROVIDER: Dr Lemmie Evens  REFERRING DIAG: Rt shoulder impingement  THERAPY DIAG:  Impingement of right shoulder  Chronic right shoulder pain  Other symptoms and signs involving the musculoskeletal system  Abnormal posture  Rationale for Evaluation and Treatment: Rehabilitation  ONSET  DATE: 09/21/21  SUBJECTIVE:                                                                                                                                                                                      SUBJECTIVE STATEMENT: Patient reports that he has had less Rt shoulder pain in the past week and is now sleeping without awakening due pain.    PERTINENT HISTORY: HTN,   PAIN:  Are you having pain? Yes: NPRS scale: 0/10 Pain location: anterior Rt shoulder  Pain description: sharp; throbbing; nagging Aggravating factors: sitting in recliner; sleeping Relieving factors: meds; stretching;  ice   PRECAUTIONS: None  WEIGHT BEARING RESTRICTIONS: No  FALLS:  Has patient fallen in last 6 months? 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   COGNITION: Overall cognitive status: Within functional limits for tasks assessed     SENSATION: WFL  POSTURE: Patient presents with head forward posture with increased thoracic kyphosis; shoulders rounded and elevated; scapulae abducted and rotated along the thoracic spine; head of the humerus anterior in orientation.   UPPER EXTREMITY ROM:   Active ROM Right eval Left eval  Shoulder flexion 140 145  Shoulder extension 48 60  Shoulder abduction 150 160  Shoulder adduction    Shoulder internal rotation Thumb T11 Thumb T8  Shoulder external rotation 70 painful  88  Elbow flexion    Elbow extension    Wrist flexion    Wrist extension    Wrist ulnar deviation     Wrist radial deviation    Wrist pronation    Wrist supination    (Blank rows = not tested)  UPPER EXTREMITY MMT: UE strength is WNL's bilat pain free with resistive testing   PALPATION:  Muscular tightness Rt > Lt through the pecs; biceps; upper trap; leveator; teres    TODAY'S TREATMENT:                                                                                                                                         DATE: 04/28/22:  Postural correction and education in sitting standing using swim noodle   THEREX:  Chin tuck 10 sec x 10 Scap squeeze 10 sec x 10 L's x 10 W's x 10  Pec stretch - doorway 3 positions 30 sec x 2 reps each position  Shoulder flexion through doorway with cane 30 sec x 2  Shoulder eternal rotation w/ noodle red TB 10 x 2 sets Rowing blue TB 10 x 2 sets Bow and arrow blue TB 10 x 2 sets Shoulder flexion stepping stretch 30 sec x 2 reps   Trigger Point Dry-Needling  Treatment instructions: Expect mild to moderate muscle soreness. S/S of pneumothorax if dry needled over a lung field, and to seek immediate medical attention should they occur. Patient verbalized understanding of these instructions and education.  Patient Consent Given: Yes Education handout provided: Previously provided Muscles treated: pecs; biceps; upper trap  Electrical stimulation performed: Yes Parameters: mAmp x 5 min to pt tolerance  Treatment response/outcome: decreased palpable tightness  04/22/22:  Postural correction and education in sitting standing using swim noodle   THEREX:  Chin tuck 10 sec x 10 Scap squeeze 10 sec x 10 L's x 10 W's x 10  Pec stretch - doorway 3 positions 30 sec x 2 reps each position   PATIENT EDUCATION: Education details: POC HEP DN myofacial ball release  Person educated: Patient Education method: Explanation, Demonstration, Tactile cues, Verbal cues, and Handouts Education comprehension: verbalized understanding, returned  demonstration, verbal cues required, tactile cues required, and needs further education  HOME EXERCISE PROGRAM:  Access Code: [email or web address] URL: [email or web address] Date: 04/28/2022 Prepared by: Corlis Leak  Exercises - Seated Cervical Retraction  - 2 x daily - 7 x weekly - 1-2 sets - 5-10 reps - 10 sec  hold - Seated Scapular Retraction  - 2 x daily - 7 x weekly - 1-2 sets - 10 reps - 10 sec  hold - Shoulder External Rotation and Scapular Retraction  - 3 x daily - 7 x weekly - 1 sets - 10 reps - 3-5 sec   hold - Shoulder External Rotation in 45 Degrees Abduction  - 2 x daily - 7 x weekly - 1-2 sets - 10 reps - 3 sec  hold - Doorway Pec Stretch at 60 Degrees Abduction  - 3 x daily - 7 x weekly - 1 sets - 3 reps - Doorway Pec Stretch at 90 Degrees Abduction  - 3 x daily - 7 x weekly - 1 sets - 3 reps - 30 seconds  hold - Doorway Pec Stretch at 120 Degrees Abduction  - 3 x daily - 7 x weekly - 1 sets - 3 reps - 30 second hold  hold - Shoulder External Rotation and Scapular Retraction with Resistance  - 2 x daily - 7 x weekly - 1 sets - 10 reps - 3-5 sec  hold - Standing Bilateral Low Shoulder Row with Anchored Resistance  - 2 x daily - 7 x weekly - 1-3 sets - 10 reps - 2-3 sec  hold - Drawing Bow  - 1 x daily - 7 x weekly - 1 sets - 10 reps - 3 sec  hold - Standing Shoulder Flexion Stretch on Wall  - 2 x daily - 7 x weekly - 2 sets - 3 reps - 10 sec  hold ASSESSMENT:  CLINICAL IMPRESSION: 04/28/22: Patient reports good improvement in Rt shoulder pain. Now sleeping without awakening due to pain. Exercises are going well at home. Reviewed and added exercises to stretch and strengthen. Tolerated all without difficulty. Issued TB for home. Trial of DN tolerated well.   Eval: Patient is a 61 y.o male who was seen today for physical therapy evaluation and treatment for Rt shoulder pain which has been present for the past 6 months with no known injury. Patient has poor posture and  alignment; limited cervical and Rt > Lt shoulder ROM; muscular tightness and pain with palpation Rt shoulder; pain with functional activities and with sleeping. Patient will benefit from PT to address problems identified.   OBJECTIVE IMPAIRMENTS: decreased mobility, decreased ROM, impaired flexibility, impaired UE functional use, improper body mechanics, postural dysfunction, and pain.   ACTIVITY LIMITATIONS: carrying, lifting, sitting, sleeping, and reach over head  PARTICIPATION LIMITATIONS: sitting and sleepint  PERSONAL FACTORS: HTN; chronic nature of symptoms; postural changes   REHAB POTENTIAL: Good  CLINICAL DECISION MAKING: Stable/uncomplicated  EVALUATION COMPLEXITY: Low   GOALS: Goals reviewed with patient? Yes  SHORT TERM GOALS: Target date:  05/20/2022   Independent in initial HEP Baseline: Goal status: INITIAL  2.  Demonstrate and verbalize improved positions and postures for functional activities and rest  Baseline:  Goal status: INITIAL   LONG TERM GOALS: Target date: 06/17/2022    Decreased Rt shoulder pain by 75-100% allowing patient to return to all normal functional activities without limitations Baseline:  Goal status: INITIAL  2.  Increased AROM Rt shoulder to =/> than AROM Lt shoulder with no pain or discomfort  Baseline:  Goal status: INITIAL  3.  Improve posture and alignment with patient to demonstrate improved upright posture with posterior shoulder girdle musculature engaged  Baseline:  Goal status: INITIAL  4.  Patient reports ability to sleep for 6-7 hours without awakening due to pain or with increased pain in Rt shoulder  Baseline:  Goal status: INITIAL  5.  Independent in HEP  Baseline:  Goal status: INITIAL   PLAN:  PT FREQUENCY: 2x/week  PT DURATION: 8 weeks  PLANNED INTERVENTIONS: Therapeutic exercises, Therapeutic activity, Neuromuscular re-education, Patient/Family education, Self Care, Joint mobilization, Aquatic Therapy,  Dry Needling, Electrical stimulation, Cryotherapy, Moist heat, Taping, Vasopneumatic device, Ultrasound, Ionotophoresis 4mg /ml Dexamethasone, Manual therapy, and Re-evaluation  PLAN FOR NEXT SESSION: review and progress with postural correction and therapeutic exercise program; assess response to manual work vs DN Rt shoulder girdle; modalities as indicated    Inetta Dicke Nilda Simmer, PT, MPH  04/22/2022, 8:02 AM

## 2022-04-30 ENCOUNTER — Ambulatory Visit (INDEPENDENT_AMBULATORY_CARE_PROVIDER_SITE_OTHER): Payer: 59 | Admitting: Family Medicine

## 2022-04-30 ENCOUNTER — Ambulatory Visit: Payer: 59 | Admitting: Rehabilitative and Restorative Service Providers"

## 2022-04-30 ENCOUNTER — Encounter: Payer: Self-pay | Admitting: Family Medicine

## 2022-04-30 ENCOUNTER — Encounter: Payer: Self-pay | Admitting: Rehabilitative and Restorative Service Providers"

## 2022-04-30 VITALS — BP 115/72 | HR 79 | Ht 69.0 in | Wt 202.0 lb

## 2022-04-30 DIAGNOSIS — M25511 Pain in right shoulder: Secondary | ICD-10-CM | POA: Diagnosis not present

## 2022-04-30 DIAGNOSIS — G8929 Other chronic pain: Secondary | ICD-10-CM | POA: Diagnosis not present

## 2022-04-30 DIAGNOSIS — E1165 Type 2 diabetes mellitus with hyperglycemia: Secondary | ICD-10-CM | POA: Diagnosis not present

## 2022-04-30 DIAGNOSIS — R972 Elevated prostate specific antigen [PSA]: Secondary | ICD-10-CM | POA: Diagnosis not present

## 2022-04-30 DIAGNOSIS — Z23 Encounter for immunization: Secondary | ICD-10-CM

## 2022-04-30 DIAGNOSIS — M25811 Other specified joint disorders, right shoulder: Secondary | ICD-10-CM

## 2022-04-30 DIAGNOSIS — E1159 Type 2 diabetes mellitus with other circulatory complications: Secondary | ICD-10-CM

## 2022-04-30 DIAGNOSIS — I152 Hypertension secondary to endocrine disorders: Secondary | ICD-10-CM

## 2022-04-30 DIAGNOSIS — E782 Mixed hyperlipidemia: Secondary | ICD-10-CM | POA: Diagnosis not present

## 2022-04-30 DIAGNOSIS — R29898 Other symptoms and signs involving the musculoskeletal system: Secondary | ICD-10-CM

## 2022-04-30 DIAGNOSIS — R293 Abnormal posture: Secondary | ICD-10-CM | POA: Diagnosis not present

## 2022-04-30 LAB — POCT UA - MICROALBUMIN
Creatinine, POC: 50 mg/dL
Microalbumin Ur, POC: 10 mg/L

## 2022-04-30 LAB — POCT GLYCOSYLATED HEMOGLOBIN (HGB A1C): HbA1c, POC (controlled diabetic range): 11.1 % — AB (ref 0.0–7.0)

## 2022-04-30 MED ORDER — LOSARTAN POTASSIUM 50 MG PO TABS: 50.0000 mg | ORAL_TABLET | Freq: Every day | ORAL | 1 refills | Status: DC

## 2022-04-30 MED ORDER — METFORMIN HCL ER 500 MG PO TB24: ORAL_TABLET | ORAL | 1 refills | Status: DC

## 2022-04-30 MED ORDER — ATORVASTATIN CALCIUM 40 MG PO TABS: 40.0000 mg | ORAL_TABLET | Freq: Every day | ORAL | 1 refills | Status: DC

## 2022-04-30 NOTE — Progress Notes (Signed)
James Fritz - 61 y.o. male MRN [id number]  Date of birth: October 05, 1960  Subjective Chief Complaint  Patient presents with   Diabetes    HPI James Fritz is a 61 year old male here today for follow-up visit.  He is a former patient of Dr. Lyn Hollingshead.  He has not been seen in nearly a year.  He has history of poorly controlled diabetes.  He is currently only on monotherapy with metformin.  He does report that he checks his blood sugars at home with readings typically in the 160s.  He denies any symptoms related to his diabetes.  He does try to stay fairly active.  Remains on atorvastatin for treatment of associated hyperlipidemia.  Remains on losartan for management of hypertension.  His blood pressure has been well controlled with this.  He denies side effects from medication.  He has not had chest pain, shortness of breath, palpitations, headaches or vision changes.  ROS:  A comprehensive ROS was completed and negative except as noted per HPI  No Known Allergies  Past Medical History:  Diagnosis Date   Colon polyps    CVA (cerebral vascular accident) (HCC)    Hydrocephalus (HCC)    Hypertension    Mixed hyperlipidemia 06/22/2017   10-yr ASCVD risk 7%   SAH (subarachnoid hemorrhage) (HCC) 2010    Past Surgical History:  Procedure Laterality Date   COLONOSCOPY W/ POLYPECTOMY     CSF SHUNT     VASECTOMY      Social History   Socioeconomic History   Marital status: Married    Spouse name: Not on file   Number of children: Not on file   Years of education: Not on file   Highest education level: Not on file  Occupational History   Not on file  Tobacco Use   Smoking status: Former    Types: Cigarettes    Quit date: 06/23/1992    Years since quitting: 29.8   Smokeless tobacco: Never  Substance and Sexual Activity   Alcohol use: Not Currently    Alcohol/week: 3.0 - 5.0 standard drinks of alcohol    Types: 3 - 5 Standard drinks or equivalent per week   Drug use: No   Sexual  activity: Yes    Partners: Female  Other Topics Concern   Not on file  Social History Narrative   Not on file   Social Determinants of Health   Financial Resource Strain: Not on file  Food Insecurity: Not on file  Transportation Needs: Not on file  Physical Activity: Not on file  Stress: Not on file  Social Connections: Not on file    Family History  Problem Relation Age of Onset   Diabetes Mother    Diabetes Father    Hypertension Father    Schizophrenia Sister    Hypertension Brother    Heart disease Brother    Prostate cancer Neg Hx     Health Maintenance  Topic Date Due   Diabetic kidney evaluation - GFR measurement  05/27/2022   OPHTHALMOLOGY EXAM  07/24/2022 (Originally 08/23/2019)   COVID-19 Vaccine (2 - Booster for Janssen series) 07/24/2022 (Originally 10/29/2019)   HEMOGLOBIN A1C  10/29/2022   Diabetic kidney evaluation - Urine ACR  05/01/2023   FOOT EXAM  05/01/2023   COLONOSCOPY (Pts 45-8yrs Insurance coverage will need to be confirmed)  08/01/2027   TETANUS/TDAP  09/04/2027   INFLUENZA VACCINE  Completed   Hepatitis C Screening  Completed   Zoster Vaccines- Shingrix  Completed  HPV VACCINES  Aged Out     ----------------------------------------------------------------------------------------------------------------------------------------------------------------------------------------------------------------- Physical Exam BP 115/72 (BP Location: Left Arm, Patient Position: Sitting, Cuff Size: Normal)   Pulse 79   Ht 5\' 9"  (1.753 m)   Wt 202 lb (91.6 kg)   SpO2 98%   BMI 29.83 kg/m   Physical Exam Constitutional:      Appearance: Normal appearance.  HENT:     Head: Normocephalic and atraumatic.  Eyes:     General: No scleral icterus. Cardiovascular:     Rate and Rhythm: Normal rate and regular rhythm.  Pulmonary:     Effort: Pulmonary effort is normal.     Breath sounds: Normal breath sounds.  Neurological:     General: No focal deficit  present.     Mental Status: He is alert.  Psychiatric:        Mood and Affect: Mood normal.        Behavior: Behavior normal.     ------------------------------------------------------------------------------------------------------------------------------------------------------------------------------------------------------------------- Assessment and Plan  Hypertension associated with diabetes (HCC) His blood pressure is well controlled at this time.  Recommend continuation of losartan at current strength.  Updated labs ordered.  Type 2 diabetes mellitus with hyperglycemia (HCC) A1c has improved since last year however still remains significantly elevated.  He will continue metformin at current strength.  Recommend adding Rybelsus 3 mg for the first month with increase to 7 mg thereafter.  Encouraged continued dietary and lifestyle change.  Elevated PSA to 4.8, urology referral placed 12/21/20 Repeat PSA.  Mixed hyperlipidemia Remains on atorvastatin.  History of CVA.  Updating lipid panel.   Meds ordered this encounter  Medications   atorvastatin (LIPITOR) 40 MG tablet    Sig: Take 1 tablet (40 mg total) by mouth daily.    Dispense:  90 tablet    Refill:  1   losartan (COZAAR) 50 MG tablet    Sig: Take 1 tablet (50 mg total) by mouth daily.    Dispense:  90 tablet    Refill:  1   metFORMIN (GLUCOPHAGE-XR) 500 MG 24 hr tablet    Sig: TAKE 2 TABLETS (1,000 MG TOTAL) BY MOUTH DAILY WITH BREAKFAST.    Dispense:  180 tablet    Refill:  1    Return in about 3 months (around 07/31/2022) for T2DM.    This visit occurred during the SARS-CoV-2 public health emergency.  Safety protocols were in place, including screening questions prior to the visit, additional usage of staff PPE, and extensive cleaning of exam room while observing appropriate contact time as indicated for disinfecting solutions.

## 2022-04-30 NOTE — Assessment & Plan Note (Signed)
Remains on atorvastatin.  History of CVA.  Updating lipid panel.

## 2022-04-30 NOTE — Patient Instructions (Addendum)
Great to meet you today! Have labs completed when fasting.  I think you would benefit from adding Rybelsus to improve your blood sugars.  Let me know if you want to try this.  Continue the metformin and work on dietary changes.  See me again in about 3 months.

## 2022-04-30 NOTE — Therapy (Signed)
OUTPATIENT PHYSICAL THERAPY SHOULDER TREATMENT   Patient Name: James Fritz MRN: [id number] DOB:1961-06-09, 61 y.o., male Today's Date: 04/30/2022   PT End of Session - 04/30/22 0926     Visit Number 3    Number of Visits 16    Date for PT Re-Evaluation 06/17/22    PT Start Time 0925    PT Stop Time 1015    PT Time Calculation (min) 50 min             Past Medical History:  Diagnosis Date   Colon polyps    CVA (cerebral vascular accident) (HCC)    Hydrocephalus (HCC)    Hypertension    Mixed hyperlipidemia 06/22/2017   10-yr ASCVD risk 7%   SAH (subarachnoid hemorrhage) (HCC) 2010   Past Surgical History:  Procedure Laterality Date   COLONOSCOPY W/ POLYPECTOMY     CSF SHUNT     VASECTOMY     Patient Active Problem List   Diagnosis Date Noted   Impingement syndrome, shoulder, right 04/16/2022   Anxiety 05/27/2021   Elevated PSA to 4.8, urology referral placed 12/21/20 12/21/2020   Other fatigue 12/22/2019   Otitis externa 12/22/2019   Varicose veins of right lower extremity 12/22/2019   Statin declined 08/16/2018   Type 2 diabetes mellitus with hyperglycemia (HCC) 05/18/2018   Hemorrhage into subarachnoid space of neuraxis (HCC) 03/02/2018   Mixed hyperlipidemia 06/22/2017   Class 1 obesity due to excess calories with serious comorbidity and body mass index (BMI) of 32.0 to 32.9 in adult 05/08/2017   Hypertension associated with diabetes (HCC) 12/21/2016   History of CVA (cerebrovascular accident) 12/16/2016   Hydrocephalus with operating shunt (HCC) 12/16/2016    PCP: Dr Sunnie Nielsen  REFERRING PROVIDER: Dr Lemmie Evens  REFERRING DIAG: Rt shoulder impingement  THERAPY DIAG:  Impingement of right shoulder  Chronic right shoulder pain  Other symptoms and signs involving the musculoskeletal system  Abnormal posture  Rationale for Evaluation and Treatment: Rehabilitation  ONSET DATE: 09/21/21  SUBJECTIVE:                                                                                                                                                                                       SUBJECTIVE STATEMENT: Patient reports that he has had less Rt shoulder pain and is sleeping better since last treatment, not awakening due to pain. Positive response to DN and manual work. Concerned with cost of PT and will call if he needs to schedule.  PERTINENT HISTORY: HTN,   PAIN:  Are you having pain? Yes: NPRS scale: 1-2/10 Pain location: anterior Rt shoulder  Pain description: constant slight ache Aggravating  factors: sitting in recliner; sleeping Relieving factors: meds; stretching; ice   PRECAUTIONS: None  WEIGHT BEARING RESTRICTIONS: No  FALLS:  Has patient fallen in last 6 months? 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   COGNITION: Overall cognitive status: Within functional limits for tasks assessed     SENSATION: WFL  POSTURE: Patient presents with head forward posture with increased thoracic kyphosis; shoulders rounded and elevated; scapulae abducted and rotated along the thoracic spine; head of the humerus anterior in orientation.   UPPER EXTREMITY ROM:   Active ROM Right eval Left eval  Shoulder flexion 140 145  Shoulder extension 48 60  Shoulder abduction 150 160  Shoulder adduction    Shoulder internal rotation Thumb T11 Thumb T8  Shoulder external rotation 70 painful  88  Elbow flexion    Elbow extension    Wrist  flexion    Wrist extension    Wrist ulnar deviation    Wrist radial deviation    Wrist pronation    Wrist supination    (Blank rows = not tested)  UPPER EXTREMITY MMT: UE strength is WNL's bilat pain free with resistive testing   PALPATION:  Muscular tightness Rt > Lt through the pecs; biceps; upper trap; leveator; teres    TODAY'S TREATMENT:                                                                                                                                         DATE: 04/30/22  THEREX:  UBE L4 x 4 min 2 min fwd/2 min back  Chin tuck 10 sec x 10 Scap squeeze 10 sec x 10 L's x 10 W's x 10  Pec stretch - doorway 3 positions 30 sec x 2 reps each position  Shoulder flexion through doorway with cane 30 sec x 2  Shoulder eternal rotation w/ noodle red TB 10 x 2 sets Rowing blue TB 10 x 2 sets Bow and arrow blue TB 10 x 2 sets Shoulder flexion stepping stretch 30 sec x 2 reps   Trigger Point Dry-Needling  Treatment instructions: Expect mild to moderate muscle soreness. S/S of pneumothorax if dry needled over a lung field, and to seek immediate medical attention should they occur. Patient verbalized understanding of these instructions and education.  Patient Consent Given: Yes Education handout provided: Previously provided Muscles treated: pecs; biceps; upper trap  Electrical stimulation performed: Yes Parameters: mAmp x 5 min to pt tolerance  Treatment  response/outcome: decreased palpable tightness    04/28/22:  Postural correction and education in sitting standing using swim noodle   THEREX:  Biceps stretch at table 30 sec x 2  Biceps stretch standing hands behind back 30 sec x 1 Thoracic extension with coregeous ball; shd flexion x 5 Thoracic rotation with coregeous ball; x 3 each side T on noodle along spine ~ 2 min UE's at ~ 80 deg abd Noodle across T spine to increase thoracic ext  Shoulder flexion through doorway with cane 30 sec x 2  Shoulder eternal  rotation w/ noodle red TB 10 x 2 sets Rowing blue TB 10 x 2 sets Bow and arrow blue TB 10 x 2 sets Shoulder flexion stepping stretch 30 sec x 2 reps  Lat stretch supine   Trigger Point Dry-Needling  Treatment instructions: Expect mild to moderate muscle soreness. S/S of pneumothorax if dry needled over a lung field, and to seek immediate medical attention should they occur. Patient verbalized understanding of these instructions and education.  Patient Consent Given: Yes Education handout provided: Previously provided Muscles treated: pecs; biceps; upper trap  Electrical stimulation performed: Yes Parameters: mAmp x 5 min to pt tolerance  Treatment response/outcome: decreased palpable tightness    PATIENT EDUCATION: Education details: POC HEP DN myofacial ball release  Person educated: Patient Education method: Explanation, Demonstration, Tactile cues, Verbal cues, and Handouts Education comprehension: verbalized understanding, returned demonstration, verbal cues required, tactile cues required, and needs further education  HOME EXERCISE PROGRAM: Access Code: [email or web address] URL: [email or web address] Date: 04/30/2022 Prepared by: Corlis Leak  Exercises - Seated Cervical Retraction  - 2 x daily - 7 x weekly - 1-2 sets - 5-10 reps - 10 sec  hold - Seated Scapular Retraction  - 2 x daily - 7 x weekly - 1-2 sets - 10 reps - 10 sec  hold - Shoulder External Rotation and Scapular Retraction  - 3 x daily - 7 x weekly - 1 sets - 10 reps - 3-5 sec   hold - Shoulder External Rotation in 45 Degrees Abduction  - 2 x daily - 7 x weekly - 1-2 sets - 10 reps - 3 sec  hold - Doorway Pec Stretch at 60 Degrees Abduction  - 3 x daily - 7 x weekly - 1 sets - 3 reps - Doorway Pec Stretch at 90 Degrees Abduction  - 3 x daily - 7 x weekly - 1 sets - 3 reps - 30 seconds  hold - Doorway Pec Stretch at 120 Degrees Abduction  - 3 x daily - 7 x weekly - 1 sets - 3 reps - 30 second hold  hold -  Shoulder External Rotation and Scapular Retraction with Resistance  - 2 x daily - 7 x weekly - 1 sets - 10 reps - 3-5 sec  hold - Standing Bilateral Low Shoulder Row with Anchored Resistance  - 2 x daily - 7 x weekly - 1-3 sets - 10 reps - 2-3 sec  hold - Drawing Bow  - 1 x daily - 7 x weekly - 1 sets - 10 reps - 3 sec  hold - Standing Shoulder Flexion Stretch on Wall  - 2 x daily - 7 x weekly - 2 sets - 3 reps - 10 sec  hold - Bicep Stretch at Table  - 2 x daily - 7 x weekly - 1 sets - 3 reps - 30 sec  hold - Chest and Bicep Stretch - Arms Behind Back  -  2 x daily - 7 x weekly - 1 sets - 3 reps - 30 sec  hold - Seated Thoracic Extension Arms Overhead  - 2 x daily - 7 x weekly - 1 sets - 3 reps - 30 sec  hold - Seated Thoracic Extension and Rotation with Reach  - 2 x daily - 7 x weekly - 1 sets - 3 reps - 10 sec  hold - Supine Chest Stretch on Foam Roll  - 2 x daily - 7 x weekly - 1 sets - 1 reps - 2-5 min  sec  hold - Thoracic Extension Mobilization on Foam Roll  - 1 x daily - 7 x weekly - 2-3 min hold  ASSESSMENT:  CLINICAL IMPRESSION: 04/30/22: Patient reports good improvement in Rt shoulder pain. Now sleeping without awakening due to pain. Exercises are going well at home. Reviewed and added exercises to stretch and strengthen. Tolerated all without difficulty. Continued DN with good release of muscular tightness. Progressing well with rehab. Would benefit from continue treatment but patient concerned with cost. Will call as needed to schedule.   Eval: Patient is a 61 y.o male who was seen today for physical therapy evaluation and treatment for Rt shoulder pain which has been present for the past 6 months with no known injury. Patient has poor posture and alignment; limited cervical and Rt > Lt shoulder ROM; muscular tightness and pain with palpation Rt shoulder; pain with functional activities and with sleeping. Patient will benefit from PT to address problems identified.   OBJECTIVE  IMPAIRMENTS: decreased mobility, decreased ROM, impaired flexibility, impaired UE functional use, improper body mechanics, postural dysfunction, and pain.   ACTIVITY LIMITATIONS: carrying, lifting, sitting, sleeping, and reach over head  PARTICIPATION LIMITATIONS: sitting and sleepint  PERSONAL FACTORS: HTN; chronic nature of symptoms; postural changes   REHAB POTENTIAL: Good  CLINICAL DECISION MAKING: Stable/uncomplicated  EVALUATION COMPLEXITY: Low   GOALS: Goals reviewed with patient? Yes  SHORT TERM GOALS: Target date: 05/20/2022   Independent in initial HEP Baseline: Goal status: INITIAL  2.  Demonstrate and verbalize improved positions and postures for functional activities and rest  Baseline:  Goal status: INITIAL   LONG TERM GOALS: Target date: 06/17/2022    Decreased Rt shoulder pain by 75-100% allowing patient to return to all normal functional activities without limitations Baseline:  Goal status: INITIAL  2.  Increased AROM Rt shoulder to =/> than AROM Lt shoulder with no pain or discomfort  Baseline:  Goal status: INITIAL  3.  Improve posture and alignment with patient to demonstrate improved upright posture with posterior shoulder girdle musculature engaged  Baseline:  Goal status: INITIAL  4.  Patient reports ability to sleep for 6-7 hours without awakening due to pain or with increased pain in Rt shoulder  Baseline:  Goal status: INITIAL  5.  Independent in HEP  Baseline:  Goal status: INITIAL   PLAN:  PT FREQUENCY: 2x/week  PT DURATION: 8 weeks  PLANNED INTERVENTIONS: Therapeutic exercises, Therapeutic activity, Neuromuscular re-education, Patient/Family education, Self Care, Joint mobilization, Aquatic Therapy, Dry Needling, Electrical stimulation, Cryotherapy, Moist heat, Taping, Vasopneumatic device, Ultrasound, Ionotophoresis 4mg /ml Dexamethasone, Manual therapy, and Re-evaluation  PLAN FOR NEXT SESSION: review and progress with postural  correction and therapeutic exercise program; assess response to manual work vs DN Rt shoulder girdle; modalities as indicated and as patient schedules.     , PT, MPH  04/22/2022, 8:02 AM

## 2022-04-30 NOTE — Assessment & Plan Note (Signed)
Repeat PSA 

## 2022-04-30 NOTE — Assessment & Plan Note (Signed)
A1c has improved since last year however still remains significantly elevated.  He will continue metformin at current strength.  Recommend adding Rybelsus 3 mg for the first month with increase to 7 mg thereafter.  Encouraged continued dietary and lifestyle change.

## 2022-04-30 NOTE — Assessment & Plan Note (Signed)
His blood pressure is well controlled at this time.  Recommend continuation of losartan at current strength.  Updated labs ordered.

## 2022-05-06 ENCOUNTER — Ambulatory Visit: Payer: 59 | Admitting: Rehabilitative and Restorative Service Providers"

## 2022-05-08 ENCOUNTER — Ambulatory Visit: Payer: 59 | Admitting: Rehabilitative and Restorative Service Providers"

## 2022-05-08 ENCOUNTER — Encounter: Payer: Self-pay | Admitting: Rehabilitative and Restorative Service Providers"

## 2022-05-08 DIAGNOSIS — M25811 Other specified joint disorders, right shoulder: Secondary | ICD-10-CM

## 2022-05-08 DIAGNOSIS — R29898 Other symptoms and signs involving the musculoskeletal system: Secondary | ICD-10-CM | POA: Diagnosis not present

## 2022-05-08 DIAGNOSIS — M25511 Pain in right shoulder: Secondary | ICD-10-CM | POA: Diagnosis not present

## 2022-05-08 DIAGNOSIS — G8929 Other chronic pain: Secondary | ICD-10-CM

## 2022-05-08 DIAGNOSIS — R293 Abnormal posture: Secondary | ICD-10-CM

## 2022-05-08 NOTE — Therapy (Addendum)
OUTPATIENT PHYSICAL THERAPY SHOULDER TREATMENT AND DISCHARGE SUMMARY   PHYSICAL THERAPY DISCHARGE SUMMARY  Visits from Start of Care: 4  Current functional level related to goals / functional outcomes: See progress note for current level of funciton   Remaining deficits: Unknown    Education / Equipment: HEP    Patient agrees to discharge. Patient goals were partially met. Patient is being discharged due to not returning since the last visit. Drena Ham P. Leonor LivHolt PT, MPH 06/30/22 1:59 PM   Patient Name: James Fritz MRN: [id number] DOB:10/13/1960, 61 y.o., male Today's Date: 05/08/2022   PT End of Session - 05/08/22 0803     Visit Number 4    Number of Visits 16    Date for PT Re-Evaluation 06/17/22    PT Start Time 0803    PT Stop Time 0849    PT Time Calculation (min) 46 min    Activity Tolerance Patient tolerated treatment well             Past Medical History:  Diagnosis Date   Colon polyps    CVA (cerebral vascular accident) (HCC)    Hydrocephalus (HCC)    Hypertension    Mixed hyperlipidemia 06/22/2017   10-yr ASCVD risk 7%   SAH (subarachnoid hemorrhage) (HCC) 2010   Past Surgical History:  Procedure Laterality Date   COLONOSCOPY W/ POLYPECTOMY     CSF SHUNT     VASECTOMY     Patient Active Problem List   Diagnosis Date Noted   Impingement syndrome, shoulder, right 04/16/2022   Anxiety 05/27/2021   Elevated PSA to 4.8, urology referral placed 12/21/20 12/21/2020   Other fatigue 12/22/2019   Otitis externa 12/22/2019   Varicose veins of right lower extremity 12/22/2019   Statin declined 08/16/2018   Type 2 diabetes mellitus with hyperglycemia (HCC) 05/18/2018   Hemorrhage into subarachnoid space of neuraxis (HCC) 03/02/2018   Mixed hyperlipidemia 06/22/2017   Class 1 obesity due to excess calories with serious comorbidity and body mass index (BMI) of 32.0 to 32.9 in adult 05/08/2017   Hypertension associated with diabetes (HCC) 12/21/2016    History of CVA (cerebrovascular accident) 12/16/2016   Hydrocephalus with operating shunt (HCC) 12/16/2016    PCP: Dr Sunnie NielsenNatalie Alexander  REFERRING PROVIDER: Dr Lemmie Evenshomas Thakkekandam  REFERRING DIAG: Rt shoulder impingement  THERAPY DIAG:  Impingement of right shoulder  Chronic right shoulder pain  Other symptoms and signs involving the musculoskeletal system  Abnormal posture  Rationale for Evaluation and Treatment: Rehabilitation  ONSET DATE: 09/21/21  SUBJECTIVE:  SUBJECTIVE STATEMENT: Patient reports that his shoulder is doing well. He has had less Rt shoulder pain and is sleeping better, not awakening due to pain. Positive response to DN and manual work.   PERTINENT HISTORY: HTN,   PAIN:  Are you having pain? Yes: NPRS scale: 1/10 Pain location: anterior Rt shoulder  Pain description: constant slight ache Aggravating factors: sitting in recliner; sleeping Relieving factors: meds; stretching; ice   PRECAUTIONS: None  WEIGHT BEARING RESTRICTIONS: No  FALLS:  Has patient fallen in last 6 months? 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   COGNITION: Overall cognitive status: Within functional limits for tasks assessed     SENSATION: WFL  POSTURE: Patient presents with head forward posture with increased  thoracic kyphosis; shoulders rounded and elevated; scapulae abducted and rotated along the thoracic spine; head of the humerus anterior in orientation.   UPPER EXTREMITY ROM:   Active ROM Right eval Left eval  Shoulder flexion 140 145  Shoulder extension 48 60  Shoulder abduction 150 160  Shoulder adduction    Shoulder internal rotation Thumb T11 Thumb T8  Shoulder external rotation 70 painful  88  Elbow flexion    Elbow extension    Wrist flexion    Wrist extension    Wrist ulnar deviation    Wrist radial deviation    Wrist pronation    Wrist supination    (Blank rows = not tested)  UPPER EXTREMITY MMT: UE strength is WNL's bilat pain free with resistive testing   PALPATION:  Muscular tightness Rt > Lt through the pecs; biceps; upper trap; leveator; teres    TODAY'S TREATMENT:                                                                                                                                         DATE: 05/08/22  THEREX:  UBE L5 x 4 min 2 min fwd/2 min back  Pec stretch - doorway 3 positions 30 sec x 2 reps each position  Shoulder eternal rotation w/ noodle red TB 10 x 2 sets Rowing blue TB 10 x 2 sets Bow and arrow blue TB 10 x 2 sets Serratus anterior red TB 10 x 2 Wall clock red TB 10 x 2  High row green TB x 10  High row to ER yellow TB x 10    Trigger Point Dry-Needling  Treatment instructions: Expect mild to moderate muscle soreness. S/S of pneumothorax if  dry needled over a lung field, and to seek immediate medical attention should they occur. Patient verbalized understanding of these instructions and education.  Patient Consent Given: Yes Education handout provided: Previously provided Muscles treated: pecs; biceps; upper trap  Electrical stimulation performed: Yes Parameters: mAmp x 5 min to pt tolerance  Treatment response/outcome: decreased palpable tightness    05/01/22 THEREX:  UBE L4 x 4 min 2 min fwd/2 min back  Chin tuck 10 sec x  10 Scap squeeze 10 sec x 10 L's x 10 W's x 10  Pec stretch - doorway 3 positions 30 sec x 2 reps each position  Shoulder flexion through doorway with cane 30 sec x 2  Shoulder eternal rotation w/ noodle red TB 10 x 2 sets Rowing blue TB 10 x 2 sets Bow and arrow blue TB 10 x 2 sets Shoulder flexion stepping stretch 30 sec x 2 reps   Trigger Point Dry-Needling  Treatment instructions: Expect mild to moderate muscle soreness. S/S of pneumothorax if dry needled over a lung field, and to seek immediate medical attention should they occur. Patient verbalized understanding of these instructions and education.  Patient Consent Given: Yes Education handout provided: Previously provided Muscles treated: pecs; biceps; upper trap  Electrical stimulation performed: Yes Parameters: mAmp x 5 min to pt tolerance  Treatment response/outcome: decreased palpable tightness   PATIENT EDUCATION: Education details: POC HEP DN myofacial ball release  Person educated: Patient Education method: Explanation, Demonstration, Tactile cues, Verbal cues, and Handouts Education comprehension: verbalized understanding, returned demonstration, verbal cues required, tactile cues required, and needs further education  HOME EXERCISE PROGRAM: Access Code: [email or web address] URL: [email or web address] Date: 05/08/2022 Prepared by: Corlis Leak  Exercises - Seated Cervical Retraction  - 2 x daily - 7 x weekly - 1-2 sets - 5-10 reps - 10 sec  hold - Seated Scapular Retraction  - 2 x daily - 7 x weekly - 1-2 sets - 10 reps - 10 sec  hold - Shoulder External Rotation and Scapular Retraction  - 3 x daily - 7 x weekly - 1 sets - 10 reps - 3-5 sec   hold - Shoulder External Rotation in 45 Degrees Abduction  - 2 x daily - 7 x weekly - 1-2 sets - 10 reps - 3 sec  hold - Doorway Pec Stretch at 60 Degrees Abduction  - 3 x daily - 7 x weekly - 1 sets - 3 reps - Doorway Pec Stretch at 90 Degrees Abduction  - 3 x daily - 7 x  weekly - 1 sets - 3 reps - 30 seconds  hold - Doorway Pec Stretch at 120 Degrees Abduction  - 3 x daily - 7 x weekly - 1 sets - 3 reps - 30 second hold  hold - Shoulder External Rotation and Scapular Retraction with Resistance  - 2 x daily - 7 x weekly - 1 sets - 10 reps - 3-5 sec  hold - Standing Bilateral Low Shoulder Row with Anchored Resistance  - 2 x daily - 7 x weekly - 1-3 sets - 10 reps - 2-3 sec  hold - Drawing Bow  - 1 x daily - 7 x weekly - 1 sets - 10 reps - 3 sec  hold - Standing Shoulder Flexion Stretch on Wall  - 2 x daily - 7 x weekly - 2 sets - 3 reps - 10 sec  hold - Bicep Stretch at Table  - 2 x daily - 7 x weekly - 1  sets - 3 reps - 30 sec  hold - Chest and Bicep Stretch - Arms Behind Back  - 2 x daily - 7 x weekly - 1 sets - 3 reps - 30 sec  hold - Seated Thoracic Extension Arms Overhead  - 2 x daily - 7 x weekly - 1 sets - 3 reps - 30 sec  hold - Seated Thoracic Extension and Rotation with Reach  - 2 x daily - 7 x weekly - 1 sets - 3 reps - 10 sec  hold - Supine Chest Stretch on Foam Roll  - 2 x daily - 7 x weekly - 1 sets - 1 reps - 2-5 min  sec  hold - Thoracic Extension Mobilization on Foam Roll  - 1 x daily - 7 x weekly - 2-3 min hold - Wall Clock with Theraband  - 1 x daily - 7 x weekly - 1-3 sets - 10 reps - 2-3 sec  hold - Shoulder Flexion Serratus Activation with Resistance  - 1 x daily - 7 x weekly - 1-3 sets - 10 reps - 3-5 sec  hold - Standing High Row with Resistance  - 1 x daily - 7 x weekly - 1-3 sets - 10 reps - 2-3 sec  hold - Shoulder External Rotation in Abduction with Anchored Resistance  - 1 x daily - 7 x weekly - 1-3 sets - 10 reps - 3 sec  hold  ASSESSMENT:  CLINICAL IMPRESSION: 05/08/22: Patient reports good improvement in Rt shoulder pain. Now sleeping without awakening due to pain. Exercises are going well at home. Reviewed and added exercises to stretch and strengthen. Tolerated all without difficulty. Continued DN with good release of muscular  tightness. Progressing well with rehab. Progressing well with goals of therapy.  Will call as needed to schedule.   Eval: Patient is a 61 y.o male who was seen today for physical therapy evaluation and treatment for Rt shoulder pain which has been present for the past 6 months with no known injury. Patient has poor posture and alignment; limited cervical and Rt > Lt shoulder ROM; muscular tightness and pain with palpation Rt shoulder; pain with functional activities and with sleeping. Patient will benefit from PT to address problems identified.   OBJECTIVE IMPAIRMENTS: decreased mobility, decreased ROM, impaired flexibility, impaired UE functional use, improper body mechanics, postural dysfunction, and pain.   ACTIVITY LIMITATIONS: carrying, lifting, sitting, sleeping, and reach over head  PARTICIPATION LIMITATIONS: sitting and sleepint  PERSONAL FACTORS: HTN; chronic nature of symptoms; postural changes   REHAB POTENTIAL: Good  CLINICAL DECISION MAKING: Stable/uncomplicated  EVALUATION COMPLEXITY: Low   GOALS: Goals reviewed with patient? Yes  SHORT TERM GOALS: Target date: 05/20/2022   Independent in initial HEP Baseline: Goal status: Accomplished  2.  Demonstrate and verbalize improved positions and postures for functional activities and rest  Baseline:  Goal status: Accomplished   LONG TERM GOALS: Target date: 06/17/2022    Decreased Rt shoulder pain by 75-100% allowing patient to return to all normal functional activities without limitations Baseline:  Goal status: Accomplished  2.  Increased AROM Rt shoulder to =/> than AROM Lt shoulder with no pain or discomfort  Baseline:  Goal status: Accomplished  3.  Improve posture and alignment with patient to demonstrate improved upright posture with posterior shoulder girdle musculature engaged  Baseline:  Goal status: Accomplished  4.  Patient reports ability to sleep for 6-7 hours without awakening due to pain or with  increased pain  in Rt shoulder  Baseline:  Goal status: Accomplished  5.  Independent in HEP  Baseline:  Goal status: Accomplished   PLAN:  PT FREQUENCY: 2x/week  PT DURATION: 8 weeks  PLANNED INTERVENTIONS: Therapeutic exercises, Therapeutic activity, Neuromuscular re-education, Patient/Family education, Self Care, Joint mobilization, Aquatic Therapy, Dry Needling, Electrical stimulation, Cryotherapy, Moist heat, Taping, Vasopneumatic device, Ultrasound, Ionotophoresis 4mg /ml Dexamethasone, Manual therapy, and Re-evaluation  PLAN FOR NEXT SESSION: patient will continue with independent management and call with any questions or problems    Chennel Olivos , PT, MPH  04/22/2022, 8:02 AM

## 2022-05-12 ENCOUNTER — Encounter: Payer: 59 | Admitting: Rehabilitative and Restorative Service Providers"

## 2022-05-14 ENCOUNTER — Encounter: Payer: 59 | Admitting: Rehabilitative and Restorative Service Providers"

## 2022-05-14 DIAGNOSIS — Z6829 Body mass index (BMI) 29.0-29.9, adult: Secondary | ICD-10-CM | POA: Diagnosis not present

## 2022-05-14 DIAGNOSIS — Z20822 Contact with and (suspected) exposure to covid-19: Secondary | ICD-10-CM | POA: Diagnosis not present

## 2022-05-14 DIAGNOSIS — I10 Essential (primary) hypertension: Secondary | ICD-10-CM | POA: Diagnosis not present

## 2022-05-14 DIAGNOSIS — J029 Acute pharyngitis, unspecified: Secondary | ICD-10-CM | POA: Diagnosis not present

## 2022-05-29 ENCOUNTER — Ambulatory Visit: Payer: 59 | Admitting: Sports Medicine

## 2022-05-29 ENCOUNTER — Encounter: Payer: Self-pay | Admitting: Family Medicine

## 2022-06-25 DIAGNOSIS — J029 Acute pharyngitis, unspecified: Secondary | ICD-10-CM | POA: Diagnosis not present

## 2022-06-25 DIAGNOSIS — Z6828 Body mass index (BMI) 28.0-28.9, adult: Secondary | ICD-10-CM | POA: Diagnosis not present

## 2022-06-25 DIAGNOSIS — I10 Essential (primary) hypertension: Secondary | ICD-10-CM | POA: Diagnosis not present

## 2022-06-30 DIAGNOSIS — M1990 Unspecified osteoarthritis, unspecified site: Secondary | ICD-10-CM | POA: Diagnosis not present

## 2022-06-30 DIAGNOSIS — K219 Gastro-esophageal reflux disease without esophagitis: Secondary | ICD-10-CM | POA: Diagnosis not present

## 2022-06-30 DIAGNOSIS — E785 Hyperlipidemia, unspecified: Secondary | ICD-10-CM | POA: Diagnosis not present

## 2022-06-30 DIAGNOSIS — E669 Obesity, unspecified: Secondary | ICD-10-CM | POA: Diagnosis not present

## 2022-06-30 DIAGNOSIS — Z8249 Family history of ischemic heart disease and other diseases of the circulatory system: Secondary | ICD-10-CM | POA: Diagnosis not present

## 2022-06-30 DIAGNOSIS — I10 Essential (primary) hypertension: Secondary | ICD-10-CM | POA: Diagnosis not present

## 2022-06-30 DIAGNOSIS — Z791 Long term (current) use of non-steroidal anti-inflammatories (NSAID): Secondary | ICD-10-CM | POA: Diagnosis not present

## 2022-06-30 DIAGNOSIS — Z7984 Long term (current) use of oral hypoglycemic drugs: Secondary | ICD-10-CM | POA: Diagnosis not present

## 2022-06-30 DIAGNOSIS — Z818 Family history of other mental and behavioral disorders: Secondary | ICD-10-CM | POA: Diagnosis not present

## 2022-06-30 DIAGNOSIS — E119 Type 2 diabetes mellitus without complications: Secondary | ICD-10-CM | POA: Diagnosis not present

## 2022-06-30 DIAGNOSIS — Z8673 Personal history of transient ischemic attack (TIA), and cerebral infarction without residual deficits: Secondary | ICD-10-CM | POA: Diagnosis not present

## 2022-06-30 DIAGNOSIS — Z833 Family history of diabetes mellitus: Secondary | ICD-10-CM | POA: Diagnosis not present

## 2022-07-31 ENCOUNTER — Encounter: Payer: Self-pay | Admitting: Family Medicine

## 2022-07-31 ENCOUNTER — Ambulatory Visit (INDEPENDENT_AMBULATORY_CARE_PROVIDER_SITE_OTHER): Payer: 59 | Admitting: Family Medicine

## 2022-07-31 VITALS — BP 169/97 | HR 81 | Ht 72.0 in | Wt 203.0 lb

## 2022-07-31 DIAGNOSIS — I152 Hypertension secondary to endocrine disorders: Secondary | ICD-10-CM | POA: Diagnosis not present

## 2022-07-31 DIAGNOSIS — R972 Elevated prostate specific antigen [PSA]: Secondary | ICD-10-CM

## 2022-07-31 DIAGNOSIS — E1159 Type 2 diabetes mellitus with other circulatory complications: Secondary | ICD-10-CM

## 2022-07-31 DIAGNOSIS — E782 Mixed hyperlipidemia: Secondary | ICD-10-CM | POA: Diagnosis not present

## 2022-07-31 DIAGNOSIS — E1165 Type 2 diabetes mellitus with hyperglycemia: Secondary | ICD-10-CM | POA: Diagnosis not present

## 2022-07-31 DIAGNOSIS — J340 Abscess, furuncle and carbuncle of nose: Secondary | ICD-10-CM | POA: Diagnosis not present

## 2022-07-31 MED ORDER — MUPIROCIN 2 % EX OINT: 1.0000 | TOPICAL_OINTMENT | Freq: Two times a day (BID) | CUTANEOUS | 0 refills | Status: AC

## 2022-07-31 NOTE — Patient Instructions (Addendum)
Apply mupirocin into nostril x7  days.

## 2022-07-31 NOTE — Assessment & Plan Note (Addendum)
Small ulceration on inside of nose.  Adding topical mupirocin.  If not improving will refer to ENT.

## 2022-07-31 NOTE — Progress Notes (Signed)
James Fritz - 62 y.o. male MRN [id number]  Date of birth: 1961-05-08  Subjective Chief Complaint  Patient presents with   Follow-up    HPI James Fritz is a 62 y.o. male here today for follow up visit.   He has history of poorly controlled diabetes.  Current management with metformin.  Recommended addition of Rybelsus previously.  He wanted to wait for this.  He denies symptoms related to his diabetes.   HTN is treated with losartan 50mg  daily.  This is elevated today.  Denies symptoms related to HTN.  Has not had chest pain, shortness of breath, palpitations, headache or vision changes.   Remains on atorvastatin.  He did not have labs completed after his last visit.   Having some shoulder pain that he has been seeing Dr. Dianah Field for.  This has improved some with rehab.  ROS:  A comprehensive ROS was completed and negative except as noted per HPI  No Known Allergies  Past Medical History:  Diagnosis Date   Colon polyps    CVA (cerebral vascular accident) ([place])    Hydrocephalus ([place])    Hypertension    Mixed hyperlipidemia 06/22/2017   10-yr ASCVD risk 7%   SAH (subarachnoid hemorrhage) ([place]) 2010    Past Surgical History:  Procedure Laterality Date   COLONOSCOPY W/ POLYPECTOMY     CSF SHUNT     VASECTOMY      Social History   Socioeconomic History   Marital status: Married    Spouse name: Not on file   Number of children: Not on file   Years of education: Not on file   Highest education level: Not on file  Occupational History   Not on file  Tobacco Use   Smoking status: Former    Types: Cigarettes    Quit date: 06/23/1992    Years since quitting: 30.1   Smokeless tobacco: Never  Substance and Sexual Activity   Alcohol use: Not Currently    Alcohol/week: 3.0 - 5.0 standard drinks of alcohol    Types: 3 - 5 Standard drinks or equivalent per week   Drug use: No   Sexual activity: Yes    Partners: Female  Other Topics Concern   Not on file  Social  History Narrative   Not on file   Social Determinants of Health   Financial Resource Strain: Not on file  Food Insecurity: Not on file  Transportation Needs: Not on file  Physical Activity: Not on file  Stress: Not on file  Social Connections: Not on file    Family History  Problem Relation Age of Onset   Diabetes Mother    Diabetes Father    Hypertension Father    Schizophrenia Sister    Hypertension Brother    Heart disease Brother    Prostate cancer Neg Hx     Health Maintenance  Topic Date Due   COVID-19 Vaccine (2 - 2023-24 season) 02/21/2022   Diabetic kidney evaluation - eGFR measurement  05/27/2022   HEMOGLOBIN A1C  10/29/2022   OPHTHALMOLOGY EXAM  01/30/2023   Diabetic kidney evaluation - Urine ACR  05/01/2023   FOOT EXAM  05/01/2023   COLONOSCOPY (Pts 45-34yrs Insurance coverage will need to be confirmed)  08/01/2027   DTaP/Tdap/Td (2 - Td or Tdap) 09/04/2027   INFLUENZA VACCINE  Completed   Hepatitis C Screening  Completed   Zoster Vaccines- Shingrix  Completed   HPV VACCINES  Aged Out     ----------------------------------------------------------------------------------------------------------------------------------------------------------------------------------------------------------------- Physical Exam  BP (!) 169/97 (BP Location: Left Arm, Patient Position: Sitting, Cuff Size: Large)   Pulse 81   Ht 6' (1.829 m)   Wt 203 lb 0.6 oz (92.1 kg)   SpO2 98%   BMI 27.54 kg/m   Physical Exam Constitutional:      Appearance: Normal appearance.  HENT:     Head: Normocephalic and atraumatic.  Eyes:     General: No scleral icterus. Cardiovascular:     Rate and Rhythm: Normal rate and regular rhythm.  Pulmonary:     Effort: Pulmonary effort is normal.     Breath sounds: Normal breath sounds.  Musculoskeletal:     Cervical back: Neck supple.  Neurological:     Mental Status: He is alert.  Psychiatric:        Mood and Affect: Mood normal.         Behavior: Behavior normal.     ------------------------------------------------------------------------------------------------------------------------------------------------------------------------------------------------------------------- Assessment and Plan  Type 2 diabetes mellitus with hyperglycemia ([place]) Update A1c today.  Historically poorly controlled.  Anticipate needing to escalate therapy further it this remain elevated.    Mixed hyperlipidemia Tolerating atorvastatin at current strength.  He did not have lipid panel completed previously, will collect today.   Elevated PSA to 4.8, urology referral placed 12/21/20 Update psa  Hypertension associated with diabetes ([place]) BP remains elevated.  Recommend repeat BP in 2 weeks at nurse visit.    Nasal ulcer Small ulceration on inside of nose.  Adding topical mupirocin.  If not improving will refer to ENT.     Meds ordered this encounter  Medications   mupirocin ointment (BACTROBAN) 2 %    Sig: Apply 1 Application topically 2 (two) times daily. Apply into nostril x7 days.    Dispense:  22 g    Refill:  0    Return in about 4 months (around 11/29/2022) for T2DM/HTN.    This visit occurred during the SARS-CoV-2 public health emergency.  Safety protocols were in place, including screening questions prior to the visit, additional usage of staff PPE, and extensive cleaning of exam room while observing appropriate contact time as indicated for disinfecting solutions.

## 2022-07-31 NOTE — Assessment & Plan Note (Signed)
BP remains elevated.  Recommend repeat BP in 2 weeks at nurse visit.

## 2022-07-31 NOTE — Assessment & Plan Note (Signed)
Tolerating atorvastatin at current strength.  He did not have lipid panel completed previously, will collect today.

## 2022-07-31 NOTE — Assessment & Plan Note (Signed)
-

## 2022-07-31 NOTE — Assessment & Plan Note (Signed)
Update A1c today.  Historically poorly controlled.  Anticipate needing to escalate therapy further it this remain elevated.

## 2022-08-01 LAB — CBC WITH DIFFERENTIAL/PLATELET
Absolute Monocytes: 566 cells/uL (ref 200–950)
Basophils Absolute: 52 cells/uL (ref 0–200)
Basophils Relative: 0.8 %
Eosinophils Absolute: 202 cells/uL (ref 15–500)
Eosinophils Relative: 3.1 %
HCT: 47.1 % (ref 38.5–50.0)
Hemoglobin: 16.1 g/dL (ref 13.2–17.1)
Lymphs Abs: 2119 cells/uL (ref 850–3900)
MCH: 30.3 pg (ref 27.0–33.0)
MCHC: 34.2 g/dL (ref 32.0–36.0)
MCV: 88.7 fL (ref 80.0–100.0)
MPV: 11.4 fL (ref 7.5–12.5)
Monocytes Relative: 8.7 %
Neutro Abs: 3562 cells/uL (ref 1500–7800)
Neutrophils Relative %: 54.8 %
Platelets: 179 10*3/uL (ref 140–400)
RBC: 5.31 10*6/uL (ref 4.20–5.80)
RDW: 12.2 % (ref 11.0–15.0)
Total Lymphocyte: 32.6 %
WBC: 6.5 10*3/uL (ref 3.8–10.8)

## 2022-08-01 LAB — COMPLETE METABOLIC PANEL WITH GFR
AG Ratio: 1.7 (calc) (ref 1.0–2.5)
ALT: 20 U/L (ref 9–46)
AST: 19 U/L (ref 10–35)
Albumin: 4.8 g/dL (ref 3.6–5.1)
Alkaline phosphatase (APISO): 90 U/L (ref 35–144)
BUN: 14 mg/dL (ref 7–25)
CO2: 24 mmol/L (ref 20–32)
Calcium: 10.2 mg/dL (ref 8.6–10.3)
Chloride: 101 mmol/L (ref 98–110)
Creat: 0.7 mg/dL (ref 0.70–1.35)
Globulin: 2.8 g/dL (calc) (ref 1.9–3.7)
Glucose, Bld: 317 mg/dL — ABNORMAL HIGH (ref 65–99)
Potassium: 4.4 mmol/L (ref 3.5–5.3)
Sodium: 138 mmol/L (ref 135–146)
Total Bilirubin: 0.8 mg/dL (ref 0.2–1.2)
Total Protein: 7.6 g/dL (ref 6.1–8.1)
eGFR: 105 mL/min/{1.73_m2} (ref >=60)

## 2022-08-01 LAB — HEMOGLOBIN A1C
Hgb A1c MFr Bld: 10.8 % of total Hgb — ABNORMAL HIGH (ref <5.7)
Mean Plasma Glucose: 263 mg/dL
eAG (mmol/L): 14.6 mmol/L

## 2022-08-01 LAB — LIPID PANEL W/REFLEX DIRECT LDL
Cholesterol: 181 mg/dL (ref <200)
HDL: 53 mg/dL (ref >=40)
LDL Cholesterol (Calc): 111 mg/dL (calc) — ABNORMAL HIGH
Non-HDL Cholesterol (Calc): 128 mg/dL (calc) (ref <130)
Total CHOL/HDL Ratio: 3.4 (calc) (ref <5.0)
Triglycerides: 82 mg/dL (ref <150)

## 2022-08-01 LAB — PSA: PSA: 2.9 ng/mL (ref <=4.00)

## 2022-08-05 ENCOUNTER — Ambulatory Visit (INDEPENDENT_AMBULATORY_CARE_PROVIDER_SITE_OTHER): Payer: 59

## 2022-08-05 ENCOUNTER — Ambulatory Visit (INDEPENDENT_AMBULATORY_CARE_PROVIDER_SITE_OTHER): Payer: 59 | Admitting: Sports Medicine

## 2022-08-05 DIAGNOSIS — M7541 Impingement syndrome of right shoulder: Secondary | ICD-10-CM | POA: Diagnosis not present

## 2022-08-05 DIAGNOSIS — M75121 Complete rotator cuff tear or rupture of right shoulder, not specified as traumatic: Secondary | ICD-10-CM

## 2022-08-05 MED ORDER — TRIAMCINOLONE ACETONIDE 40 MG/ML IJ SUSP
40.0000 mg | Freq: Once | INTRAMUSCULAR | Status: AC
  Administered 2022-08-05: 40 mg via INTRAMUSCULAR

## 2022-08-05 NOTE — Assessment & Plan Note (Addendum)
This is a very pleasant 62 year old male chronic right shoulder pain impingement signs and symptoms, he has done several months of physical therapy, x-rays did show acromioclavicular degenerative changes. He is about 85% now, but still having some discomfort so we will proceed with subacromial injection, return to see me in 6 weeks. Of note I did see a full-thickness retracted supraspinatus tear on ultrasound.

## 2022-08-05 NOTE — Progress Notes (Signed)
    Procedures performed today:    Procedure: Real-time Ultrasound Guided injection of the right subacromial bursa Device: Samsung HS60  Verbal informed consent obtained.  Time-out conducted.  Noted no overlying erythema, induration, or other signs of local infection.  Skin prepped in a sterile fashion.  Local anesthesia: Topical Ethyl chloride.  With sterile technique and under real time ultrasound guidance: Noted complete retracted supraspinatus tear, 1 cc Kenalog 40, 1 cc lidocaine, 1 cc bupivacaine injected easily Completed without difficulty  Advised to call if fevers/chills, erythema, induration, drainage, or persistent bleeding.  Images permanently stored and available for review in PACS.  Impression: Technically successful ultrasound guided injection.  Independent interpretation of notes and tests performed by another provider:   None.  Brief History, Exam, Impression, and Recommendations:    Rotator cuff tear, right This is a very pleasant 62 year old male chronic right shoulder pain impingement signs and symptoms, he has done several months of physical therapy, x-rays did show acromioclavicular degenerative changes. He is about 85% now, but still having some discomfort so we will proceed with subacromial injection, return to see me in 6 weeks. Of note I did see a full-thickness retracted supraspinatus tear on ultrasound.    ____________________________________________ Gwen Her. Dianah Field, M.D., ABFM., CAQSM., AME. Primary Care and Sports Medicine [facility]  Adjunct Professor of [employer]  Risk manager

## 2022-08-05 NOTE — Addendum Note (Signed)
Addended by: Tarri Glenn A on: 08/05/2022 09:48 AM   Modules accepted: Orders

## 2022-08-12 ENCOUNTER — Other Ambulatory Visit: Payer: Self-pay | Admitting: Sports Medicine

## 2022-08-12 DIAGNOSIS — M7541 Impingement syndrome of right shoulder: Secondary | ICD-10-CM

## 2022-08-14 ENCOUNTER — Ambulatory Visit: Payer: 59

## 2022-08-21 ENCOUNTER — Ambulatory Visit (INDEPENDENT_AMBULATORY_CARE_PROVIDER_SITE_OTHER): Payer: 59 | Admitting: Family Medicine

## 2022-08-21 ENCOUNTER — Encounter: Payer: Self-pay | Admitting: Family Medicine

## 2022-08-21 DIAGNOSIS — M542 Cervicalgia: Secondary | ICD-10-CM | POA: Diagnosis not present

## 2022-08-21 DIAGNOSIS — M25511 Pain in right shoulder: Secondary | ICD-10-CM | POA: Diagnosis not present

## 2022-08-21 MED ORDER — METAXALONE 800 MG PO TABS: 800.0000 mg | ORAL_TABLET | Freq: Three times a day (TID) | ORAL | 0 refills | Status: AC | PRN

## 2022-08-21 NOTE — Assessment & Plan Note (Addendum)
No red flags or bony tenderness to indicate imaging.  Continue meloxicam as needed.  Adding skelaxin as he is concerned about sedating qualities of other muscle relaxers.  Referral placed to PT as well.
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
## 2022-08-22 ENCOUNTER — Ambulatory Visit (INDEPENDENT_AMBULATORY_CARE_PROVIDER_SITE_OTHER): Payer: 59 | Admitting: Family Medicine

## 2022-08-22 ENCOUNTER — Encounter: Payer: Self-pay | Admitting: Family Medicine

## 2022-08-22 VITALS — BP 137/79 | HR 63 | Ht 72.0 in | Wt 207.0 lb

## 2022-08-22 DIAGNOSIS — R300 Dysuria: Secondary | ICD-10-CM

## 2022-08-22 DIAGNOSIS — R81 Glycosuria: Secondary | ICD-10-CM | POA: Diagnosis not present

## 2022-08-22 LAB — POCT URINALYSIS DIP (CLINITEK)
Bilirubin, UA: NEGATIVE
Blood, UA: NEGATIVE
Glucose, UA: 500 mg/dL — AB
Ketones, POC UA: NEGATIVE mg/dL
Leukocytes, UA: NEGATIVE
Nitrite, UA: NEGATIVE
POC PROTEIN,UA: NEGATIVE
Spec Grav, UA: >=1.03 — AB (ref 1.010–1.025)
Urobilinogen, UA: 0.2 E.U./dL
pH, UA: 5.5 (ref 5.0–8.0)

## 2022-08-22 NOTE — Assessment & Plan Note (Signed)
He has significant glucosuria which is likely causing his symptoms. No signs of UTI.  We discussed importance of controlling his blood sugar to prevent future complications.  He declines additional medication to help with control of his blood sugar.  Remains adamant on controlling this with dietary changes.

## 2022-08-22 NOTE — Progress Notes (Signed)
James Fritz - 62 y.o. male MRN [id number]  Date of birth: 10/02/1960  Subjective Chief Complaint  Patient presents with   Urinary Tract Infection    HPI James Fritz is a 62 y.o. male here today with complaint of possible UTI.  He reports that he has had urinary frequency and burning sensation with urination for about 1 week.  This has improved over the past couple of days but wanted to get this checked out.  Denies blood in his urine, trouble starting his stream or feelings of incomplete emptying.   He does have a history of poorly controlled T2DM and BPH  ROS:  A comprehensive ROS was completed and negative except as noted per HPI  No Known Allergies  Past Medical History:  Diagnosis Date   Colon polyps    CVA (cerebral vascular accident) ([place])    Hydrocephalus ([place])    Hypertension    Mixed hyperlipidemia 06/22/2017   10-yr ASCVD risk 7%   SAH (subarachnoid hemorrhage) ([place]) 2010    Past Surgical History:  Procedure Laterality Date   COLONOSCOPY W/ POLYPECTOMY     CSF SHUNT     VASECTOMY      Social History   Socioeconomic History   Marital status: Married    Spouse name: Not on file   Number of children: Not on file   Years of education: Not on file   Highest education level: Not on file  Occupational History   Not on file  Tobacco Use   Smoking status: Former    Types: Cigarettes    Quit date: 06/23/1992    Years since quitting: 30.1   Smokeless tobacco: Never  Substance and Sexual Activity   Alcohol use: Not Currently    Alcohol/week: 3.0 - 5.0 standard drinks of alcohol    Types: 3 - 5 Standard drinks or equivalent per week   Drug use: No   Sexual activity: Yes    Partners: Female  Other Topics Concern   Not on file  Social History Narrative   Not on file   Social Determinants of Health   Financial Resource Strain: Not on file  Food Insecurity: Not on file  Transportation Needs: Not on file  Physical Activity: Not on file  Stress: Not on  file  Social Connections: Not on file    Family History  Problem Relation Age of Onset   Diabetes Mother    Diabetes Father    Hypertension Father    Schizophrenia Sister    Hypertension Brother    Heart disease Brother    Prostate cancer Neg Hx     Health Maintenance  Topic Date Due   COVID-19 Vaccine (2 - 2023-24 season) 08/17/2023 (Originally 02/21/2022)   HEMOGLOBIN A1C  01/29/2023   OPHTHALMOLOGY EXAM  01/30/2023   Diabetic kidney evaluation - Urine ACR  05/01/2023   FOOT EXAM  05/01/2023   Diabetic kidney evaluation - eGFR measurement  08/01/2023   COLONOSCOPY (Pts 45-38yr Insurance coverage will need to be confirmed)  08/01/2027   DTaP/Tdap/Td (2 - Td or Tdap) 09/04/2027   INFLUENZA VACCINE  Completed   Hepatitis C Screening  Completed   Zoster Vaccines- Shingrix  Completed   HPV VACCINES  Aged Out     ----------------------------------------------------------------------------------------------------------------------------------------------------------------------------------------------------------------- Physical Exam BP 137/79 (BP Location: Right Arm, Patient Position: Sitting, Cuff Size: Normal)   Pulse 63   Ht 6' (1.829 m)   Wt 207 lb (93.9 kg)   SpO2 97%   BMI 28.07 kg/m  Physical Exam Constitutional:      Appearance: Normal appearance.  HENT:     Head: Normocephalic and atraumatic.  Eyes:     General: No scleral icterus. Cardiovascular:     Rate and Rhythm: Normal rate and regular rhythm.  Pulmonary:     Effort: Pulmonary effort is normal.     Breath sounds: Normal breath sounds.  Musculoskeletal:     Cervical back: Neck supple.  Neurological:     Mental Status: He is alert.  Psychiatric:        Mood and Affect: Mood normal.        Behavior: Behavior normal.      ------------------------------------------------------------------------------------------------------------------------------------------------------------------------------------------------------------------- Assessment and Plan  Glucosuria He has significant glucosuria which is likely causing his symptoms. No signs of UTI.  We discussed importance of controlling his blood sugar to prevent future complications.  He declines additional medication to help with control of his blood sugar.  Remains adamant on controlling this with dietary changes.     No orders of the defined types were placed in this encounter.   No follow-ups on file.    This visit occurred during the SARS-CoV-2 public health emergency.  Safety protocols were in place, including screening questions prior to the visit, additional usage of staff PPE, and extensive cleaning of exam room while observing appropriate contact time as indicated for disinfecting solutions.

## 2022-08-25 ENCOUNTER — Encounter: Payer: Self-pay | Admitting: Rehabilitative and Restorative Service Providers"

## 2022-08-25 ENCOUNTER — Other Ambulatory Visit: Payer: Self-pay

## 2022-08-25 ENCOUNTER — Ambulatory Visit: Payer: 59 | Attending: Family Medicine | Admitting: Rehabilitative and Restorative Service Providers"

## 2022-08-25 DIAGNOSIS — M25511 Pain in right shoulder: Secondary | ICD-10-CM | POA: Diagnosis not present

## 2022-08-25 DIAGNOSIS — M542 Cervicalgia: Secondary | ICD-10-CM | POA: Insufficient documentation

## 2022-08-25 DIAGNOSIS — Y939 Activity, unspecified: Secondary | ICD-10-CM | POA: Diagnosis not present

## 2022-08-25 DIAGNOSIS — Z5189 Encounter for other specified aftercare: Secondary | ICD-10-CM | POA: Diagnosis not present
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
## 2022-08-27 ENCOUNTER — Encounter: Payer: 59 | Admitting: Rehabilitative and Restorative Service Providers"

## 2022-09-01 ENCOUNTER — Ambulatory Visit: Payer: 59 | Admitting: Rehabilitative and Restorative Service Providers"

## 2022-09-01 ENCOUNTER — Encounter: Payer: Self-pay | Admitting: Rehabilitative and Restorative Service Providers"

## 2022-09-01 DIAGNOSIS — Y939 Activity, unspecified: Secondary | ICD-10-CM | POA: Diagnosis not present

## 2022-09-01 DIAGNOSIS — M25511 Pain in right shoulder: Secondary | ICD-10-CM | POA: Diagnosis not present

## 2022-09-01 DIAGNOSIS — Z5189 Encounter for other specified aftercare: Secondary | ICD-10-CM | POA: Diagnosis not present

## 2022-09-01 DIAGNOSIS — M542 Cervicalgia: Secondary | ICD-10-CM
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
## 2022-09-03 ENCOUNTER — Ambulatory Visit: Payer: 59

## 2022-09-03 DIAGNOSIS — R29898 Other symptoms and signs involving the musculoskeletal system: Secondary | ICD-10-CM

## 2022-09-03 DIAGNOSIS — M25511 Pain in right shoulder: Secondary | ICD-10-CM

## 2022-09-03 DIAGNOSIS — G8929 Other chronic pain: Secondary | ICD-10-CM

## 2022-09-03 DIAGNOSIS — Y939 Activity, unspecified: Secondary | ICD-10-CM | POA: Diagnosis not present

## 2022-09-03 DIAGNOSIS — M25811 Other specified joint disorders, right shoulder: Secondary | ICD-10-CM

## 2022-09-03 DIAGNOSIS — M542 Cervicalgia: Secondary | ICD-10-CM | POA: Diagnosis not present

## 2022-09-03 DIAGNOSIS — R293 Abnormal posture: Secondary | ICD-10-CM

## 2022-09-03 DIAGNOSIS — Z5189 Encounter for other specified aftercare: Secondary | ICD-10-CM | POA: Diagnosis not present
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
## 2022-09-08 ENCOUNTER — Encounter: Payer: 59 | Admitting: Rehabilitative and Restorative Service Providers"

## 2022-09-10 ENCOUNTER — Encounter: Payer: 59 | Admitting: Rehabilitative and Restorative Service Providers"

## 2022-09-12 ENCOUNTER — Ambulatory Visit: Payer: 59 | Admitting: Rehabilitative and Restorative Service Providers"

## 2022-09-15 ENCOUNTER — Ambulatory Visit: Payer: 59 | Admitting: Rehabilitative and Restorative Service Providers"

## 2022-09-15 DIAGNOSIS — M542 Cervicalgia: Secondary | ICD-10-CM | POA: Diagnosis not present

## 2022-09-15 DIAGNOSIS — Y939 Activity, unspecified: Secondary | ICD-10-CM | POA: Diagnosis not present

## 2022-09-15 DIAGNOSIS — Z5189 Encounter for other specified aftercare: Secondary | ICD-10-CM | POA: Diagnosis not present

## 2022-09-15 DIAGNOSIS — M25511 Pain in right shoulder: Secondary | ICD-10-CM | POA: Diagnosis not present
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
## 2022-09-16 ENCOUNTER — Ambulatory Visit (INDEPENDENT_AMBULATORY_CARE_PROVIDER_SITE_OTHER): Payer: 59 | Admitting: Sports Medicine

## 2022-09-16 DIAGNOSIS — M75121 Complete rotator cuff tear or rupture of right shoulder, not specified as traumatic: Secondary | ICD-10-CM | POA: Diagnosis not present

## 2022-09-16 NOTE — Assessment & Plan Note (Signed)
Pleasant 62 year old male, chronic right shoulder pain, he had impingement signs and symptoms, he had done several months of physical therapy, x-rays had shown acromioclavicular degenerative changes. He was about 80% better with physical therapy, still had some discomfort so I did a subacromial injection at the last visit, I did see a full-thickness retracted supraspinatus tear on ultrasound. He has done extremely well, he is 90+ percent better, most days have no pain, he has no weakness and full motion. Continue physical therapy until he is graduated to home exercise program and return to see me as needed. I have asked him to limit his power when he serves playing tennis.

## 2022-09-16 NOTE — Progress Notes (Signed)
    Procedures performed today:    None.  Independent interpretation of notes and tests performed by another provider:   None.  Brief History, Exam, Impression, and Recommendations:    Rotator cuff tear, right James Fritz 62 year old male, chronic right shoulder pain, he had impingement signs and symptoms, he had done several months of physical therapy, x-rays had shown acromioclavicular degenerative changes. He was about 80% better with physical therapy, still had some discomfort so I did a subacromial injection at the last visit, I did see a full-thickness retracted supraspinatus tear on ultrasound. He has done extremely well, he is 90+ percent better, most days have no pain, he has no weakness and full motion. Continue physical therapy until he is graduated to home exercise program and return to see me as needed. I have asked him to limit his power when he serves playing tennis.    ____________________________________________ Gwen Her. Dianah Field, M.D., ABFM., CAQSM., AME. Primary Care and Sports Medicine [facility]  Adjunct Professor of [employer]  Risk manager

## 2022-09-17 ENCOUNTER — Ambulatory Visit: Payer: 59 | Admitting: Rehabilitative and Restorative Service Providers"

## 2022-09-17 ENCOUNTER — Encounter: Payer: Self-pay | Admitting: Rehabilitative and Restorative Service Providers"

## 2022-09-17 DIAGNOSIS — M542 Cervicalgia: Secondary | ICD-10-CM | POA: Diagnosis not present

## 2022-09-17 DIAGNOSIS — G8929 Other chronic pain: Secondary | ICD-10-CM

## 2022-09-17 DIAGNOSIS — Y939 Activity, unspecified: Secondary | ICD-10-CM | POA: Diagnosis not present

## 2022-09-17 DIAGNOSIS — M25811 Other specified joint disorders, right shoulder: Secondary | ICD-10-CM

## 2022-09-17 DIAGNOSIS — M25511 Pain in right shoulder: Secondary | ICD-10-CM

## 2022-09-17 DIAGNOSIS — Z5189 Encounter for other specified aftercare: Secondary | ICD-10-CM | POA: Diagnosis not present
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
## 2022-09-26 ENCOUNTER — Ambulatory Visit: Payer: 59 | Attending: Sports Medicine | Admitting: Rehabilitative and Restorative Service Providers"

## 2022-09-26 DIAGNOSIS — M25511 Pain in right shoulder: Secondary | ICD-10-CM | POA: Insufficient documentation

## 2022-09-26 DIAGNOSIS — M25811 Other specified joint disorders, right shoulder: Secondary | ICD-10-CM | POA: Insufficient documentation

## 2022-09-26 DIAGNOSIS — G8929 Other chronic pain: Secondary | ICD-10-CM | POA: Insufficient documentation

## 2022-09-26 DIAGNOSIS — M542 Cervicalgia: Secondary | ICD-10-CM | POA: Insufficient documentation

## 2022-09-29 ENCOUNTER — Ambulatory Visit: Payer: 59 | Admitting: Rehabilitative and Restorative Service Providers"

## 2022-09-29 ENCOUNTER — Encounter: Payer: Self-pay | Admitting: Rehabilitative and Restorative Service Providers"

## 2022-09-29 DIAGNOSIS — G8929 Other chronic pain: Secondary | ICD-10-CM | POA: Diagnosis present

## 2022-09-29 DIAGNOSIS — M25511 Pain in right shoulder: Secondary | ICD-10-CM | POA: Diagnosis present

## 2022-09-29 DIAGNOSIS — M542 Cervicalgia: Secondary | ICD-10-CM | POA: Diagnosis present

## 2022-09-29 DIAGNOSIS — M25811 Other specified joint disorders, right shoulder: Secondary | ICD-10-CM | POA: Diagnosis present
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
## 2022-10-01 ENCOUNTER — Ambulatory Visit: Payer: 59 | Admitting: Rehabilitative and Restorative Service Providers"

## 2022-10-03 ENCOUNTER — Encounter: Payer: Self-pay | Admitting: Rehabilitative and Restorative Service Providers"

## 2022-10-03 ENCOUNTER — Ambulatory Visit: Payer: 59 | Admitting: Rehabilitative and Restorative Service Providers"

## 2022-10-03 DIAGNOSIS — G8929 Other chronic pain: Secondary | ICD-10-CM | POA: Diagnosis not present

## 2022-10-03 DIAGNOSIS — M25811 Other specified joint disorders, right shoulder: Secondary | ICD-10-CM | POA: Diagnosis not present

## 2022-10-03 DIAGNOSIS — M25511 Pain in right shoulder: Secondary | ICD-10-CM

## 2022-10-03 DIAGNOSIS — M542 Cervicalgia: Secondary | ICD-10-CM
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
## 2022-10-08 ENCOUNTER — Ambulatory Visit: Payer: 59 | Admitting: Rehabilitative and Restorative Service Providers"

## 2022-10-08 ENCOUNTER — Encounter: Payer: Self-pay | Admitting: Rehabilitative and Restorative Service Providers"

## 2022-10-08 DIAGNOSIS — M25811 Other specified joint disorders, right shoulder: Secondary | ICD-10-CM | POA: Diagnosis not present

## 2022-10-08 DIAGNOSIS — M542 Cervicalgia: Secondary | ICD-10-CM | POA: Diagnosis not present

## 2022-10-08 DIAGNOSIS — G8929 Other chronic pain: Secondary | ICD-10-CM | POA: Diagnosis not present

## 2022-10-08 DIAGNOSIS — M25511 Pain in right shoulder: Secondary | ICD-10-CM | POA: Diagnosis not present
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
## 2022-10-13 ENCOUNTER — Ambulatory Visit: Payer: 59 | Admitting: Rehabilitative and Restorative Service Providers"

## 2022-10-17 ENCOUNTER — Ambulatory Visit: Payer: 59 | Admitting: Rehabilitative and Restorative Service Providers"

## 2022-10-17 ENCOUNTER — Encounter: Payer: Self-pay | Admitting: Rehabilitative and Restorative Service Providers"

## 2022-10-17 DIAGNOSIS — M542 Cervicalgia: Secondary | ICD-10-CM

## 2022-10-17 DIAGNOSIS — M25811 Other specified joint disorders, right shoulder: Secondary | ICD-10-CM

## 2022-10-17 DIAGNOSIS — G8929 Other chronic pain: Secondary | ICD-10-CM

## 2022-10-17 DIAGNOSIS — M25511 Pain in right shoulder: Secondary | ICD-10-CM | POA: Diagnosis not present
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
## 2022-12-02 ENCOUNTER — Ambulatory Visit: Payer: 59 | Admitting: Family Medicine

## 2022-12-03 ENCOUNTER — Other Ambulatory Visit: Payer: Self-pay | Admitting: Family Medicine

## 2022-12-03 DIAGNOSIS — E782 Mixed hyperlipidemia: Secondary | ICD-10-CM

## 2022-12-03 DIAGNOSIS — I152 Hypertension secondary to endocrine disorders: Secondary | ICD-10-CM

## 2022-12-03 DIAGNOSIS — E1165 Type 2 diabetes mellitus with hyperglycemia: Secondary | ICD-10-CM

## 2023-01-07 ENCOUNTER — Other Ambulatory Visit: Payer: Self-pay | Admitting: Sports Medicine

## 2023-01-07 DIAGNOSIS — M7541 Impingement syndrome of right shoulder: Secondary | ICD-10-CM

## 2023-04-17 DIAGNOSIS — I10 Essential (primary) hypertension: Secondary | ICD-10-CM | POA: Diagnosis not present

## 2023-04-17 DIAGNOSIS — Z681 Body mass index (BMI) 19 or less, adult: Secondary | ICD-10-CM | POA: Diagnosis not present

## 2023-04-17 DIAGNOSIS — J020 Streptococcal pharyngitis: Secondary | ICD-10-CM | POA: Diagnosis not present

## 2023-05-08 DIAGNOSIS — S81802A Unspecified open wound, left lower leg, initial encounter: Secondary | ICD-10-CM | POA: Diagnosis not present

## 2023-05-08 DIAGNOSIS — I10 Essential (primary) hypertension: Secondary | ICD-10-CM | POA: Diagnosis not present

## 2023-05-08 DIAGNOSIS — Z6827 Body mass index (BMI) 27.0-27.9, adult: Secondary | ICD-10-CM | POA: Diagnosis not present

## 2023-05-08 DIAGNOSIS — E119 Type 2 diabetes mellitus without complications: Secondary | ICD-10-CM | POA: Diagnosis not present

## 2023-05-12 ENCOUNTER — Other Ambulatory Visit: Payer: Self-pay | Admitting: Sports Medicine

## 2023-05-12 DIAGNOSIS — M7541 Impingement syndrome of right shoulder: Secondary | ICD-10-CM

## 2023-05-29 NOTE — Telephone Encounter (Signed)
Chart updated. Closing encounter

## 2023-06-13 ENCOUNTER — Other Ambulatory Visit: Payer: Self-pay | Admitting: Family Medicine

## 2023-06-13 DIAGNOSIS — E1165 Type 2 diabetes mellitus with hyperglycemia: Secondary | ICD-10-CM

## 2023-06-13 DIAGNOSIS — I152 Hypertension secondary to endocrine disorders: Secondary | ICD-10-CM

## 2023-06-29 ENCOUNTER — Other Ambulatory Visit: Payer: Self-pay | Admitting: Family Medicine

## 2023-06-29 DIAGNOSIS — E1165 Type 2 diabetes mellitus with hyperglycemia: Secondary | ICD-10-CM

## 2023-06-29 DIAGNOSIS — I152 Hypertension secondary to endocrine disorders: Secondary | ICD-10-CM

## 2023-06-30 NOTE — Telephone Encounter (Signed)
 Pls contact the pt to schedule appt with Dr. Ashley Royalty for DM & HTN. Sending last refill request. No additional meds without kept appt. Thx.

## 2023-06-30 NOTE — Telephone Encounter (Signed)
 Called patient, patient has moved to [facility], thanks.

## 2023-08-29 ENCOUNTER — Other Ambulatory Visit: Payer: Self-pay | Admitting: Family Medicine

## 2023-08-29 DIAGNOSIS — E1159 Type 2 diabetes mellitus with other circulatory complications: Secondary | ICD-10-CM

## 2023-08-31 NOTE — Telephone Encounter (Signed)
 Pls contact the pt to schedule appt for DM & HTN with Dr. Ashley Royalty. Last OV: 07/31/22. Unable to refill medications.

## 2023-09-09 ENCOUNTER — Other Ambulatory Visit: Payer: Self-pay | Admitting: Family Medicine

## 2023-09-09 DIAGNOSIS — E1159 Type 2 diabetes mellitus with other circulatory complications: Secondary | ICD-10-CM

## 2023-09-10 NOTE — Telephone Encounter (Signed)
Patient has moved to [place].

## 2023-09-10 NOTE — Telephone Encounter (Signed)
 Pls contact patient to schedule appt with Dr. Ashley Royalty for HTN. Last seen 08/20/22. Sending 15 day med refill.

## 2023-09-18 ENCOUNTER — Other Ambulatory Visit: Payer: Self-pay | Admitting: Family Medicine

## 2023-09-18 DIAGNOSIS — E1159 Type 2 diabetes mellitus with other circulatory complications: Secondary | ICD-10-CM

## 2023-09-22 ENCOUNTER — Other Ambulatory Visit: Payer: Self-pay | Admitting: Family Medicine

## 2023-09-22 DIAGNOSIS — E1159 Type 2 diabetes mellitus with other circulatory complications: Secondary | ICD-10-CM

## 2024-02-23 ENCOUNTER — Encounter: Payer: Self-pay | Admitting: Sports Medicine
